# Patient Record
Sex: Female | Born: 1970
Health system: Southern US, Community
[De-identification: ages and names within clinical notes are randomized; demographics above are authoritative.]

## PROBLEM LIST (undated history)

## (undated) DIAGNOSIS — I499 Cardiac arrhythmia, unspecified: Secondary | ICD-10-CM

## (undated) DIAGNOSIS — O139 Gestational [pregnancy-induced] hypertension without significant proteinuria, unspecified trimester: Secondary | ICD-10-CM

## (undated) DIAGNOSIS — D649 Anemia, unspecified: Secondary | ICD-10-CM

## (undated) HISTORY — PX: WISDOM TOOTH EXTRACTION: SHX21

---

## 2000-01-09 ENCOUNTER — Ambulatory Visit (HOSPITAL_COMMUNITY): Admission: RE | Admit: 2000-01-09 | Discharge: 2000-01-09 | Payer: Self-pay | Admitting: Obstetrics

## 2000-04-07 ENCOUNTER — Inpatient Hospital Stay (HOSPITAL_COMMUNITY): Admission: AD | Admit: 2000-04-07 | Discharge: 2000-04-09 | Payer: Self-pay | Admitting: *Deleted

## 2000-04-12 ENCOUNTER — Encounter: Admission: RE | Admit: 2000-04-12 | Discharge: 2000-07-11 | Payer: Self-pay | Admitting: *Deleted

## 2001-11-20 ENCOUNTER — Inpatient Hospital Stay (HOSPITAL_COMMUNITY): Admission: AD | Admit: 2001-11-20 | Discharge: 2001-11-20 | Payer: Self-pay | Admitting: *Deleted

## 2001-12-25 ENCOUNTER — Ambulatory Visit (HOSPITAL_COMMUNITY): Admission: RE | Admit: 2001-12-25 | Discharge: 2001-12-25 | Payer: Self-pay | Admitting: *Deleted

## 2002-07-10 ENCOUNTER — Inpatient Hospital Stay (HOSPITAL_COMMUNITY): Admission: AD | Admit: 2002-07-10 | Discharge: 2002-07-12 | Payer: Self-pay | Admitting: Obstetrics and Gynecology

## 2004-09-12 ENCOUNTER — Ambulatory Visit: Payer: Self-pay | Admitting: Family Medicine

## 2004-09-13 ENCOUNTER — Ambulatory Visit: Payer: Self-pay | Admitting: *Deleted

## 2005-06-26 ENCOUNTER — Ambulatory Visit (HOSPITAL_COMMUNITY): Admission: RE | Admit: 2005-06-26 | Discharge: 2005-06-26 | Payer: Self-pay | Admitting: *Deleted

## 2005-08-28 ENCOUNTER — Ambulatory Visit (HOSPITAL_COMMUNITY): Admission: RE | Admit: 2005-08-28 | Discharge: 2005-08-28 | Payer: Self-pay | Admitting: *Deleted

## 2005-11-30 ENCOUNTER — Ambulatory Visit (HOSPITAL_COMMUNITY): Admission: RE | Admit: 2005-11-30 | Discharge: 2005-11-30 | Payer: Self-pay | Admitting: Obstetrics & Gynecology

## 2005-12-03 ENCOUNTER — Inpatient Hospital Stay (HOSPITAL_COMMUNITY): Admission: AD | Admit: 2005-12-03 | Discharge: 2005-12-05 | Payer: Self-pay | Admitting: Obstetrics

## 2007-03-05 IMAGING — US US OB COMP +14 WK
1 series · 13 of 28 positions shown · non-contrast
Comparison: none

CLINICAL DATA: Anatomic evaluation.

[Series 1: us ob comp +14 wk · 0.24mm/px · 13 of 76 slices shown]
[im 3/76]
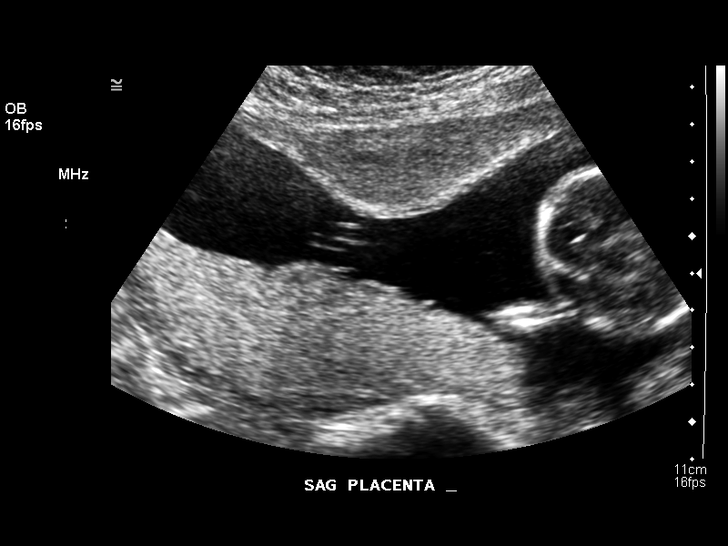
[im 9/76]
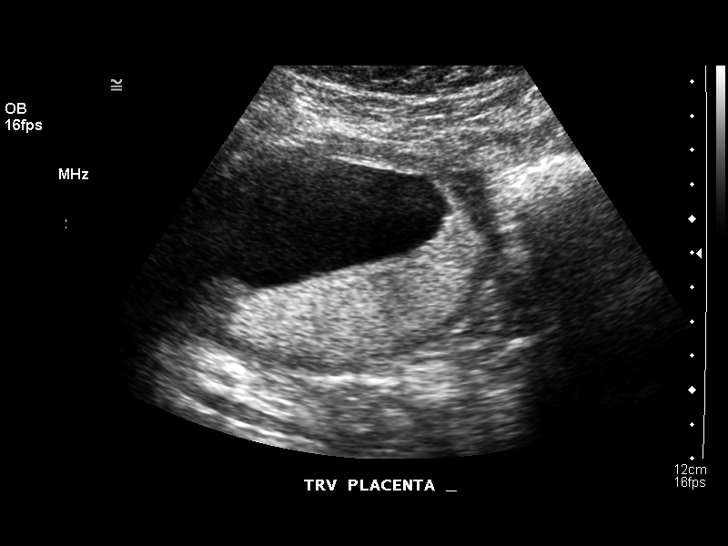
[im 14/76]
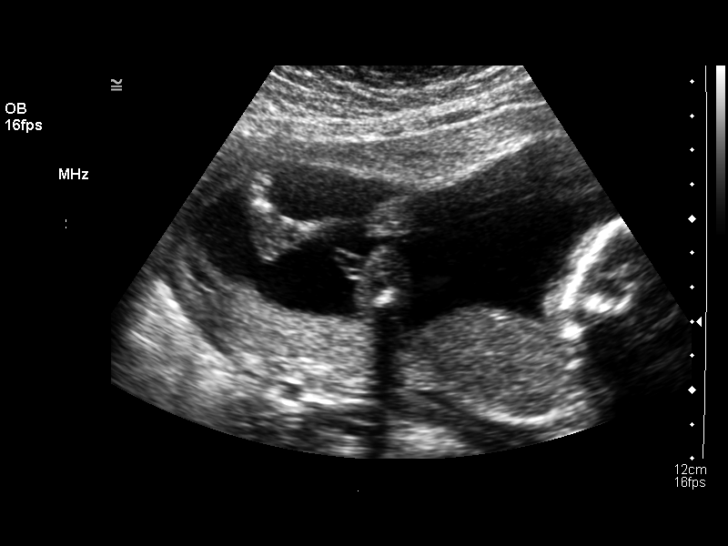
[im 20/76]
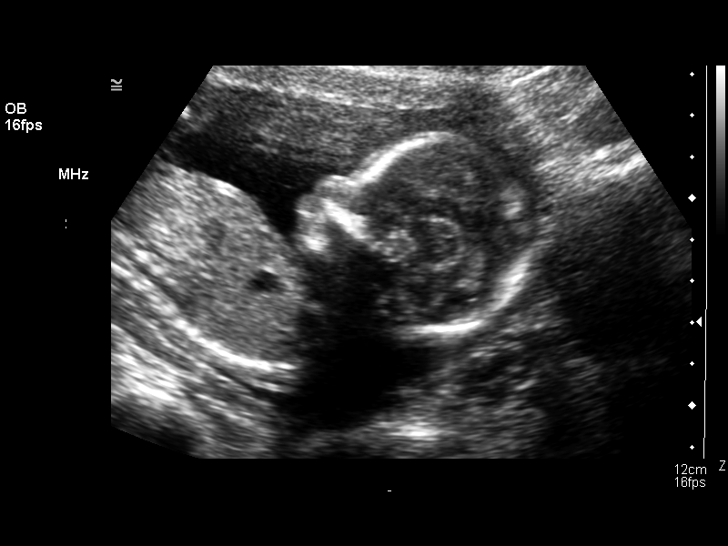
[im 26/76]
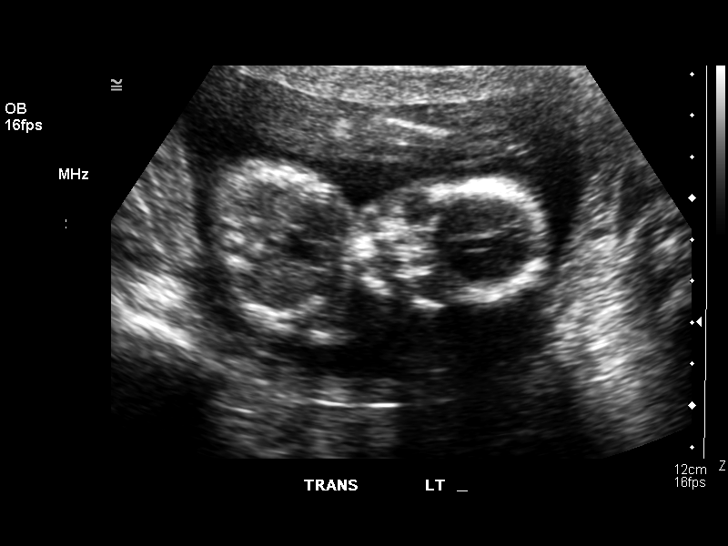
[im 31/76]
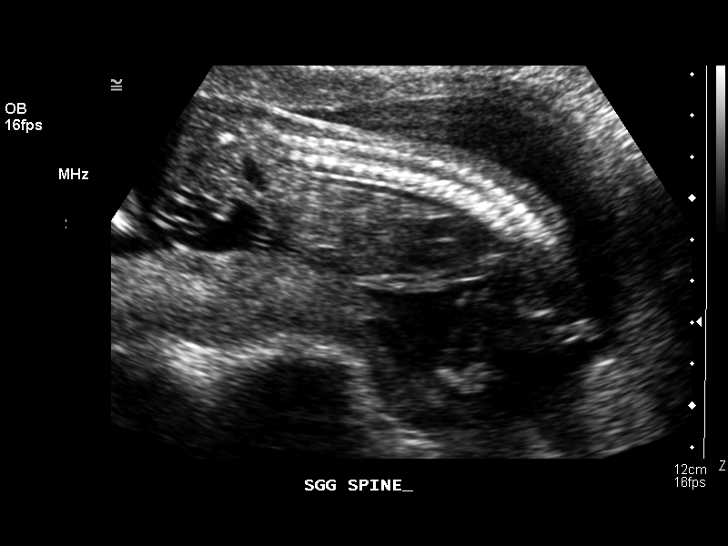
[im 39/76]
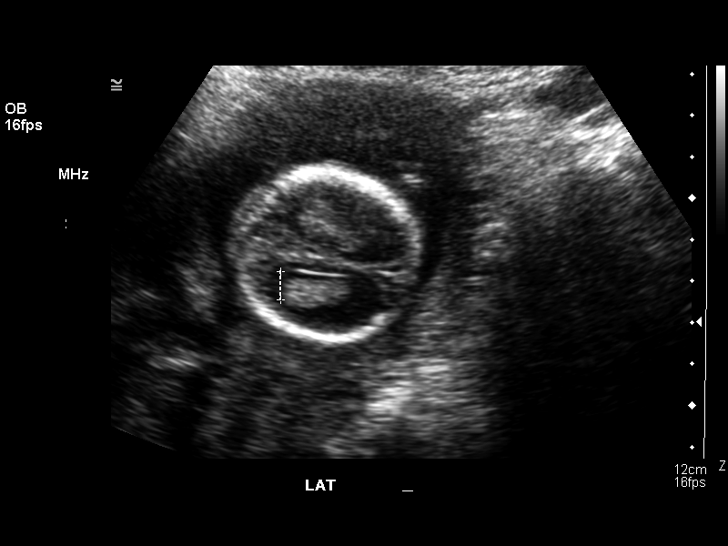
[im 45/76]
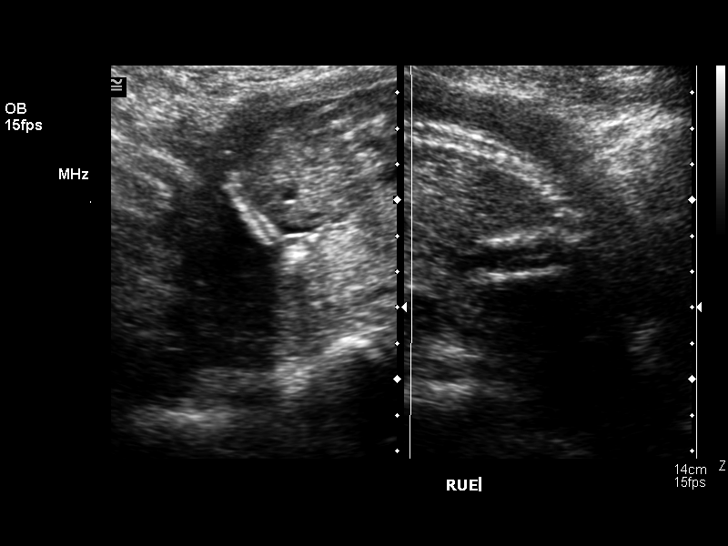
[im 51/76]
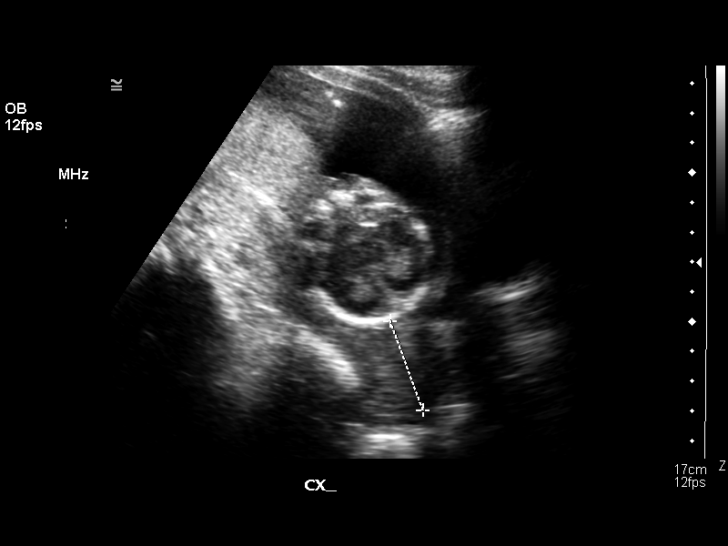
[im 56/76]
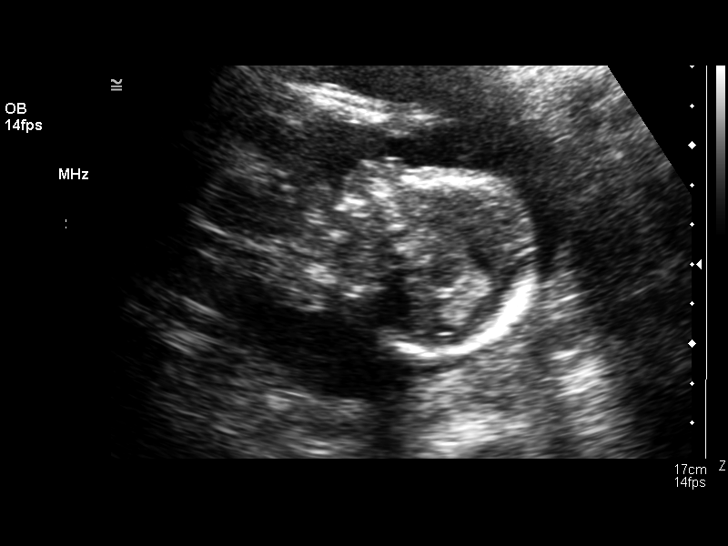
[im 62/76]
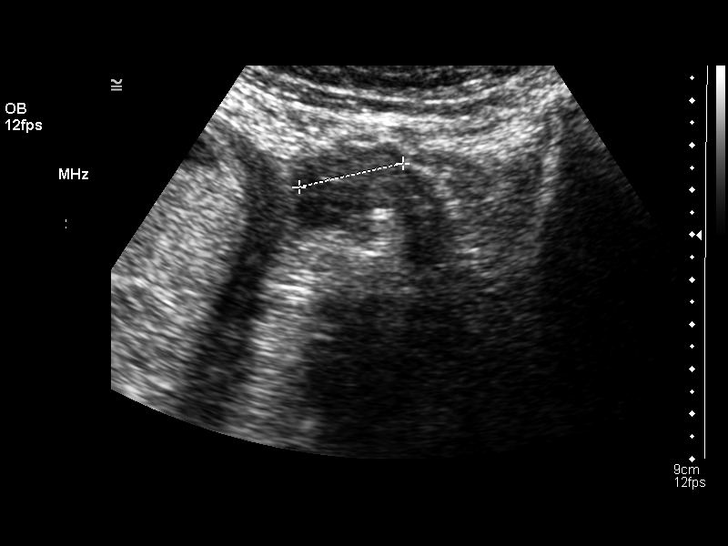
[im 67/76]
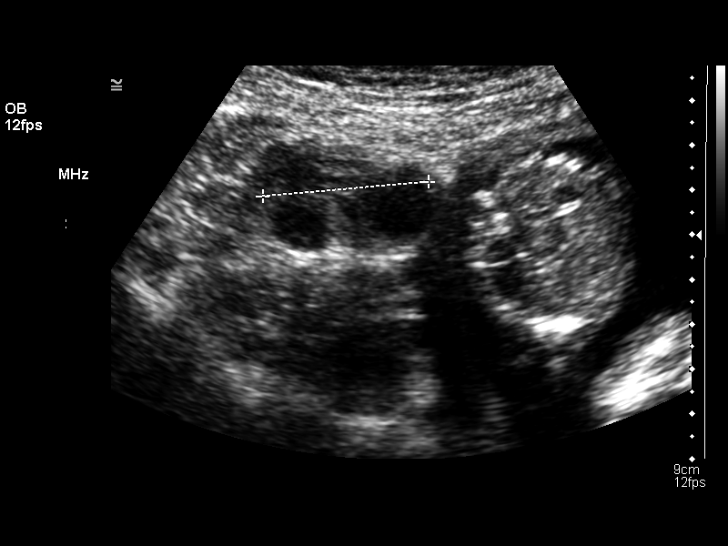
[im 73/76]
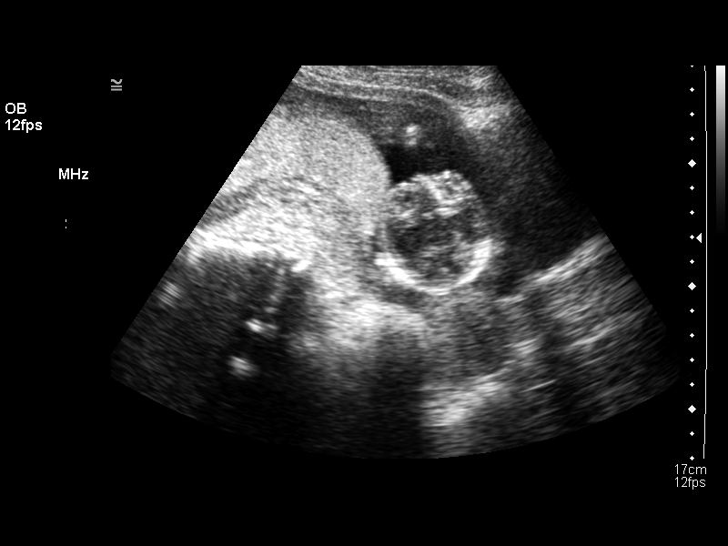

[13 of 28 positions shown; findings below may reference images not displayed]

OBSTETRICAL ULTRASOUND:
Number of Fetuses:  1
Heart Rate:  153
Movement:  Yes
Breathing:  No  
Presentation:  Cephalic
Placental Location:  Posterior
Grade:  I
Previa:  No
Amniotic Fluid (Subjective):  Normal
Amniotic Fluid (Objective):   6.1 cm Vertical pocket 

FETAL BIOMETRY
BPD:  4.2 cm   18 w 6 d
HC:  15.0 cm  17 w 6 d
AC:  12.4 cm   18 w 0 d
FL:    2.5 cm   17 w 4 d

MEAN GA:  18 w 1 d

FETAL ANATOMY
Lateral Ventricles:    Visualized 
Thalami/CSP:      Visualized 
Posterior Fossa:  Visualized 
Nuchal Region:    Visualized 
Spine:      Visualized 
4 Chamber Heart on Left:      Visualized 
Stomach on Left:      Visualized 
3 Vessel Cord:    Visualized 
Cord Insertion site:    Visualized 
Kidneys:  Visualized 
Bladder:  Visualized 
Extremities:      Visualized 

ADDITIONAL ANATOMY VISUALIZED:  LVOT, upper lip, orbits, profile, diaphragm, heel, 5th digit, ductal arch, aortic arch, male genitalia and nasal bone.

MATERNAL UTERINE AND ADNEXAL FINDINGS
Cervix:   3.4 cm transabdominally
IMPRESSION: 1.  Single intrauterine pregnancy demonstrating an estimated gestational age by ultrasound of 18 weeks and 1 day.  Correlation with expected estimated gestational age by LMP of 17 weeks and 5 days suggests appropriate growth.
2.  No focal fetal or placental abnormalities are noted with a good anatomic exam possible.  Only the RVOT could not be seen with confidence due to positioning on today?s exam.

## 2010-09-24 ENCOUNTER — Inpatient Hospital Stay (HOSPITAL_COMMUNITY)
Admission: AD | Admit: 2010-09-24 | Discharge: 2010-09-24 | Payer: Self-pay | Source: Home / Self Care | Attending: Obstetrics & Gynecology | Admitting: Obstetrics & Gynecology

## 2010-09-27 ENCOUNTER — Ambulatory Visit (HOSPITAL_COMMUNITY)
Admission: RE | Admit: 2010-09-27 | Discharge: 2010-09-27 | Payer: Self-pay | Source: Home / Self Care | Attending: Obstetrics & Gynecology | Admitting: Obstetrics & Gynecology

## 2010-09-29 ENCOUNTER — Ambulatory Visit: Payer: Self-pay | Admitting: Obstetrics and Gynecology

## 2010-10-02 NOTE — L&D Delivery Note (Signed)
Delivery Note At 4:58 PM a viable female was delivered via Vaginal, Spontaneous Delivery (Presentation: Vtx, LOA, compound with posterior arm ).  APGAR: 9, 9; weight 7 lb 5.3 oz (3325 g).   Placenta status: Intact, Spontaneous.  Cord: 3 vessels with the following complications: loose nuchal cord x 1, easily reduced.    Anesthesia: Epidural  Episiotomy: None Lacerations: 2nd degree Suture Repair: 2.0 3.0 vicryl rapide Est. Blood Loss (mL): 300 ml  Mom to postpartum.  Baby to nursery-stable.  JACKSON-MOORE,Raesha Coonrod A 08/02/2011, 5:34 PM

## 2010-11-23 ENCOUNTER — Inpatient Hospital Stay (INDEPENDENT_AMBULATORY_CARE_PROVIDER_SITE_OTHER)
Admission: RE | Admit: 2010-11-23 | Discharge: 2010-11-23 | Disposition: A | Discharge status: Home / Self Care | Payer: Self-pay | Source: Ambulatory Visit | Attending: Family Medicine | Admitting: Family Medicine

## 2010-11-23 DIAGNOSIS — M549 Dorsalgia, unspecified: Secondary | ICD-10-CM

## 2010-11-23 LAB — POCT URINALYSIS DIPSTICK
Bilirubin Urine: NEGATIVE
Protein, ur: NEGATIVE mg/dL
Specific Gravity, Urine: >=1.03 (ref 1.005–1.030)
Urobilinogen, UA: 0.2 mg/dL (ref 0.0–1.0)

## 2010-11-24 LAB — URINE CULTURE: Colony Count: NO GROWTH

## 2010-12-12 LAB — WET PREP, GENITAL
Clue Cells Wet Prep HPF POC: NONE SEEN
Yeast Wet Prep HPF POC: NONE SEEN

## 2010-12-12 LAB — URINALYSIS, ROUTINE W REFLEX MICROSCOPIC
Bilirubin Urine: NEGATIVE
Nitrite: NEGATIVE
Urobilinogen, UA: 0.2 mg/dL (ref 0.0–1.0)
pH: 6 (ref 5.0–8.0)

## 2010-12-12 LAB — URINE MICROSCOPIC-ADD ON

## 2010-12-12 LAB — HCG, QUANTITATIVE, PREGNANCY: hCG, Beta Chain, Quant, S: <2 m[IU]/mL (ref <5)

## 2010-12-12 LAB — GC/CHLAMYDIA PROBE AMP, GENITAL: GC Probe Amp, Genital: NEGATIVE

## 2011-01-04 ENCOUNTER — Inpatient Hospital Stay (HOSPITAL_COMMUNITY)
Admission: AD | Admit: 2011-01-04 | Discharge: 2011-01-04 | Disposition: A | Discharge status: Home / Self Care | Payer: Medicaid Other | Source: Ambulatory Visit | Attending: Obstetrics & Gynecology | Admitting: Obstetrics & Gynecology

## 2011-01-04 DIAGNOSIS — O99019 Anemia complicating pregnancy, unspecified trimester: Secondary | ICD-10-CM | POA: Insufficient documentation

## 2011-01-04 DIAGNOSIS — D649 Anemia, unspecified: Secondary | ICD-10-CM | POA: Insufficient documentation

## 2011-01-04 LAB — CBC
Hemoglobin: 7.5 g/dL — ABNORMAL LOW (ref 12.0–15.0)
MCV: 62.9 fL — ABNORMAL LOW (ref 78.0–100.0)
Platelets: 300 10*3/uL (ref 150–400)
WBC: 8.1 10*3/uL (ref 4.0–10.5)

## 2011-01-04 LAB — URINALYSIS, ROUTINE W REFLEX MICROSCOPIC: Nitrite: NEGATIVE

## 2011-01-04 LAB — URINE MICROSCOPIC-ADD ON

## 2011-01-31 LAB — RPR: RPR: NONREACTIVE

## 2011-01-31 LAB — ABO/RH

## 2011-01-31 LAB — HEPATITIS B SURFACE ANTIGEN: Hepatitis B Surface Ag: NEGATIVE

## 2011-01-31 LAB — HIV ANTIBODY (ROUTINE TESTING W REFLEX): HIV: NONREACTIVE

## 2011-02-11 LAB — RPR: RPR: NONREACTIVE

## 2011-04-12 ENCOUNTER — Inpatient Hospital Stay (HOSPITAL_COMMUNITY)
Admission: AD | Admit: 2011-04-12 | Discharge: 2011-04-12 | Disposition: A | Discharge status: Home / Self Care | Payer: Medicaid Other | Source: Ambulatory Visit | Attending: Obstetrics | Admitting: Obstetrics

## 2011-07-27 ENCOUNTER — Encounter (HOSPITAL_COMMUNITY): Payer: Self-pay

## 2011-07-27 ENCOUNTER — Observation Stay (HOSPITAL_COMMUNITY)
Admission: AD | Admit: 2011-07-27 | Discharge: 2011-07-27 | Disposition: A | Discharge status: Home / Self Care | Payer: Medicaid Other | Source: Ambulatory Visit | Attending: Obstetrics & Gynecology | Admitting: Obstetrics & Gynecology

## 2011-07-27 DIAGNOSIS — O139 Gestational [pregnancy-induced] hypertension without significant proteinuria, unspecified trimester: Principal | ICD-10-CM | POA: Insufficient documentation

## 2011-07-27 HISTORY — DX: Cardiac arrhythmia, unspecified: I49.9

## 2011-07-27 HISTORY — DX: Gestational (pregnancy-induced) hypertension without significant proteinuria, unspecified trimester: O13.9

## 2011-07-27 HISTORY — DX: Anemia, unspecified: D64.9

## 2011-07-27 LAB — COMPREHENSIVE METABOLIC PANEL
ALT: 13 U/L (ref 0–35)
Albumin: 2.5 g/dL — ABNORMAL LOW (ref 3.5–5.2)
Alkaline Phosphatase: 113 U/L (ref 39–117)
BUN: 7 mg/dL (ref 6–23)
Chloride: 109 mEq/L (ref 96–112)
Glucose, Bld: 74 mg/dL (ref 70–99)
Potassium: 4 mEq/L (ref 3.5–5.1)
Sodium: 140 mEq/L (ref 135–145)
Total Bilirubin: 0.4 mg/dL (ref 0.3–1.2)

## 2011-07-27 LAB — STREP B DNA PROBE: GBS: NEGATIVE

## 2011-07-27 LAB — CBC
HCT: 38 % (ref 36.0–46.0)
MCV: 90.3 fL (ref 78.0–100.0)
Platelets: 220 10*3/uL (ref 150–400)
RBC: 4.21 MIL/uL (ref 3.87–5.11)
WBC: 5.4 10*3/uL (ref 4.0–10.5)

## 2011-07-27 LAB — LACTATE DEHYDROGENASE: LDH: 260 U/L — ABNORMAL HIGH (ref 94–250)

## 2011-07-27 MED ORDER — ONDANSETRON HCL 4 MG/2ML IJ SOLN: 4.0000 mg | Freq: Four times a day (QID) | INTRAMUSCULAR | Status: DC | PRN

## 2011-07-27 MED ORDER — OXYTOCIN 20 UNITS IN LACTATED RINGERS INFUSION - SIMPLE: 125.0000 mL/h | Freq: Once | INTRAVENOUS | Status: DC

## 2011-07-27 MED ORDER — OXYTOCIN 20 UNITS IN LACTATED RINGERS INFUSION - SIMPLE
1.0000 m[IU]/min | INTRAVENOUS | Status: DC
  Administered 2011-07-27: 2 m[IU]/min via INTRAVENOUS
  Filled 2011-07-27: qty 1000

## 2011-07-27 MED ORDER — HYDROXYZINE HCL 50 MG PO TABS: 50.0000 mg | ORAL_TABLET | Freq: Four times a day (QID) | ORAL | Status: DC | PRN

## 2011-07-27 MED ORDER — OXYCODONE-ACETAMINOPHEN 5-325 MG PO TABS: 2.0000 | ORAL_TABLET | ORAL | Status: DC | PRN

## 2011-07-27 MED ORDER — OXYTOCIN BOLUS FROM INFUSION
500.0000 mL | Freq: Once | INTRAVENOUS | Status: DC
  Filled 2011-07-27: qty 500

## 2011-07-27 MED ORDER — CITRIC ACID-SODIUM CITRATE 334-500 MG/5ML PO SOLN: 30.0000 mL | ORAL | Status: DC | PRN

## 2011-07-27 MED ORDER — ZOLPIDEM TARTRATE 10 MG PO TABS: 10.0000 mg | ORAL_TABLET | Freq: Every evening | ORAL | Status: DC | PRN

## 2011-07-27 MED ORDER — BUTORPHANOL TARTRATE 2 MG/ML IJ SOLN: 1.0000 mg | INTRAMUSCULAR | Status: DC | PRN

## 2011-07-27 MED ORDER — HYDROXYZINE HCL 50 MG/ML IM SOLN: 50.0000 mg | Freq: Four times a day (QID) | INTRAMUSCULAR | Status: DC | PRN

## 2011-07-27 MED ORDER — LIDOCAINE HCL (PF) 1 % IJ SOLN: 30.0000 mL | INTRAMUSCULAR | Status: DC | PRN

## 2011-07-27 MED ORDER — ACETAMINOPHEN 325 MG PO TABS: 650.0000 mg | ORAL_TABLET | ORAL | Status: DC | PRN

## 2011-07-27 MED ORDER — IBUPROFEN 600 MG PO TABS: 600.0000 mg | ORAL_TABLET | Freq: Four times a day (QID) | ORAL | Status: DC | PRN

## 2011-07-27 MED ORDER — LACTATED RINGERS IV SOLN
INTRAVENOUS | Status: DC
  Administered 2011-07-27: 125 mL/h via INTRAVENOUS

## 2011-07-27 MED ORDER — TERBUTALINE SULFATE 1 MG/ML IJ SOLN: 0.2500 mg | Freq: Once | INTRAMUSCULAR | Status: DC | PRN

## 2011-07-27 MED ORDER — LACTATED RINGERS IV SOLN: 500.0000 mL | INTRAVENOUS | Status: DC | PRN

## 2011-07-27 NOTE — Discharge Summary (Signed)
Physician Discharge Summary  Patient ID: Becky Cruz MRN: [id number] DOB/AGE: 04/19/1971 40 y.o.  Admit date: 07/27/2011 Discharge date: 07/27/2011  Admission Diagnoses:  R/O PIH  Discharge Diagnoses:  Failed IOL  Discharged Condition: stable  Hospital Course: The patient was admitted.  A Pitocin drip was started.  There was no progressive cervical dilation.  The patient remained normotensive.  A PIH panel was essentially normal.  The Pitocin was discontinued and the patient discharged.   Treatments: See above  Discharge Exam: Blood pressure 126/71, pulse 78, temperature 98.4 F (36.9 C), temperature source Oral, resp. rate 18, height 5\' 4"  (1.626 m), weight 80.74 kg (178 lb). General appearance: alert and no distress  Disposition: Home or Self Care  Discharge Orders    Future Orders Please Complete By Expires   LABOR:  When conractions begin, you should start to time them from the beginning of one contraction to the beginning  of the next.  When contractions are 5 - 10 minutes apart or less and have been regular for at least an hour, you should call your health care provider.      Notify physician for bleeding from the vagina      Notify physician for pain or burning when urinating      Notify physician for chills or fever      Notify physician for increase in vaginal discharge      Notify physician for pelvic pressure (sudden increase)      Notify physician if baby moving less than usual      Notify physician for sudden, constant, or occasional abdominal pain      Notify physician for sudden gushing of fluid from the vagina (with or without continued leaking)      Notify physician for leaking of fluid      Notify physician for fainting spells, "black outs" or loss of consciousness      Notify physician for severe or continued nausea or vomiting      Notify physician for blurring of vision or spots before the eyes      Strep B DNA probe      Comments:   This external  order was created through the Results Console.   HIV antibody      Comments:   This external order was created through the Results Console.   Rubella antibody, IgM      Comments:   This external order was created through the Results Console.   Hepatitis B surface antigen      Comments:   This external order was created through the Results Console.   RPR      Comments:   This external order was created through the Results Console.   HIV antibody      Comments:   This external order was created through the Results Console.   RPR      Comments:   This external order was created through the Results Console.   Antibody screen      Comments:   This external order was created through the Results Console.   ABO/Rh      Comments:   This external order was created through the Results Console.     Current Discharge Medication List    CONTINUE these medications which have NOT CHANGED   Details  IRON PO Take 1 tablet by mouth daily.      prenatal vitamin w/FE, FA (PRENATAL 1 + 1) 27-1 MG TABS Take 1 tablet by mouth daily.  Follow-up Information    Follow up with Antionette Char A, MD. Call in 3 days.   Contact information:   [address] [place] [address] [phone]          Signed: Roseanna Rainbow 07/27/2011, 6:32 PM

## 2011-07-27 NOTE — Progress Notes (Signed)
Patient was told to come to hospital this am for induction of labor due to elevated BP. Patient reports good fetal movement and no symptoms, no leaking or bleeding.

## 2011-07-27 NOTE — Progress Notes (Signed)
Pt states sent from MD office for elevated bp's yesterday, denies bleeding or lof, does have decreased fm since last pm, denies pain at present. For induction today, however birthing suites is currently unable to take pt at present.

## 2011-07-27 NOTE — Discharge Summary (Signed)
Discharge instructions given to pt, pt verbalized understanding and had no questions. Will call dr. Isidore Moos and make appt for Monday.

## 2011-07-27 NOTE — Progress Notes (Signed)
Becky Cruz is a 40 y.o. G4P3003 at [redacted]w[redacted]d by LMP admitted for induction of labor due to [facility].  Subjective: No complaints  Objective: BP 126/71  Pulse 78  Temp(Src) 98.4 F (36.9 C) (Oral)  Resp 18  Ht 5\' 4"  (1.626 m)  Wt 80.74 kg (178 lb)  BMI 30.55 kg/m2      FHT:  FHR: 140 bpm, variability: moderate,  accelerations:  Present,  decelerations:  Absent UC:   irregular, every 10 minutes SVE:   Dilation: 2 Effacement (%): 60 Station: -2 Exam by:: felkel,rn  Labs: Lab Results  Component Value Date   WBC 5.4 07/27/2011   HGB 12.8 07/27/2011   HCT 38.0 07/27/2011   MCV 90.3 07/27/2011   PLT 220 07/27/2011    Assessment / Plan: BPs normal.  Labs essentially normal.  Minimal response to Pitocin  Labor: See above Preeclampsia:  See above Fetal Wellbeing:  Category I Pain Control:  Labor support without medications I/D:  n/a D/C home  JACKSON-MOORE,Jakarie Pember A 07/27/2011, 6:25 PM

## 2011-07-27 NOTE — H&P (Signed)
Becky Cruz is a 40 y.o. female presenting for IOL. Maternal Medical History:  Reason for admission: IOL for possible PIH.  BPs in office on 10/24 140s/80s.  No neurological symptoms.  Fetal activity: Perceived fetal activity is normal.   Last perceived fetal movement was within the past hour.    Prenatal complications: Maternal cardiac arrrhytmia    OB History    Grav Para Term Preterm Abortions TAB SAB Ect Mult Living   4 3 3       3      Past Medical History  Diagnosis Date  . Pregnancy induced hypertension   . Anemia   . Dysrhythmia    Past Surgical History  Procedure Date  . Wisdom tooth extraction    Family History: family history is not on file. Social History:  reports that she has never smoked. She has never used smokeless tobacco. She reports that she does not drink alcohol or use illicit drugs.  Review of Systems  Constitutional: Negative for fever.  Eyes: Negative for blurred vision.  Respiratory: Negative for shortness of breath.   Gastrointestinal: Negative for vomiting.  Skin: Negative for rash.  Neurological: Negative.  Negative for headaches.      Blood pressure 129/73, pulse 77, temperature 98.3 F (36.8 C), temperature source Oral, resp. rate 20, height 5\' 4"  (1.626 m), weight 80.74 kg (178 lb). Maternal Exam:  Abdomen: Patient reports no abdominal tenderness. Introitus: not evaluated.     Fetal Exam Fetal Monitor Review: Variability: moderate (6-25 bpm).   Pattern: accelerations present and no decelerations.    Fetal State Assessment: Category I - tracings are normal.     Physical Exam  Constitutional: She appears well-developed.  HENT:  Head: Normocephalic.  Neck: Neck supple. No thyromegaly present.  Cardiovascular: Normal rate and regular rhythm.   Respiratory: Breath sounds normal.  GI: Soft. Bowel sounds are normal.  Skin: No rash noted.    Prenatal labs: ABO, Rh: O/Positive/-- (05/01 1610) Antibody: Negative (05/01  0921) Rubella: Immune (05/01 0921) RPR: Nonreactive (05/12 0000)  HBsAg: Negative (05/01 9604)  HIV: Non-reactive (05/12 0000)  GBS: Negative (10/25 0901)   Assessment/Plan: Multipara with an IUP @ [redacted]w[redacted]d.  R/O PIH--mild.  Admission IOL Check PIH panel  JACKSON-MOORE,[name] 07/27/2011, 10:12 AM

## 2011-07-30 ENCOUNTER — Inpatient Hospital Stay (HOSPITAL_COMMUNITY)
Admission: AD | Admit: 2011-07-30 | Discharge: 2011-07-30 | Disposition: A | Discharge status: Home / Self Care | Payer: Medicaid Other | Source: Ambulatory Visit | Attending: Obstetrics & Gynecology | Admitting: Obstetrics & Gynecology

## 2011-07-30 DIAGNOSIS — O479 False labor, unspecified: Secondary | ICD-10-CM | POA: Insufficient documentation

## 2011-07-30 NOTE — ED Provider Notes (Signed)
C/o LOF at 1500 today, none since  FHR: 150 mod variability, + accels, no decels Toco: Irreg ctx SSE: no pooling, neg fern SVE: 1.5/50/vtx/no fluid return with exam  Becky Cruz E. 07/30/2011 10:48 PM

## 2011-07-30 NOTE — Progress Notes (Signed)
S.Shores in and did a spec exam to r/o rupture-SVD done also-sttes she feels the bag od water intact

## 2011-07-30 NOTE — Progress Notes (Signed)
Pt reports she noticed some fluid leaking at 1500, was not having contractions so at 1700 she started having some pressure and noticed a "few more drops of water", called the office number and they told her to come in. G4P3, all vaginal deliveries. Denies problems with preg.

## 2011-08-01 ENCOUNTER — Telehealth (HOSPITAL_COMMUNITY): Payer: Self-pay | Admitting: *Deleted

## 2011-08-01 ENCOUNTER — Other Ambulatory Visit: Payer: Self-pay | Admitting: Obstetrics & Gynecology

## 2011-08-01 ENCOUNTER — Encounter (HOSPITAL_COMMUNITY): Payer: Self-pay | Admitting: *Deleted

## 2011-08-01 NOTE — Telephone Encounter (Signed)
Preadmission screen  

## 2011-08-02 ENCOUNTER — Inpatient Hospital Stay (HOSPITAL_COMMUNITY)
Admission: RE | Admit: 2011-08-02 | Discharge: 2011-08-04 | DRG: 775 | Disposition: A | Discharge status: Home / Self Care | Payer: Medicaid Other | Source: Ambulatory Visit | Attending: Obstetrics & Gynecology | Admitting: Obstetrics & Gynecology

## 2011-08-02 ENCOUNTER — Encounter (HOSPITAL_COMMUNITY): Payer: Self-pay

## 2011-08-02 ENCOUNTER — Encounter (HOSPITAL_COMMUNITY): Payer: Self-pay | Admitting: Anesthesiology

## 2011-08-02 ENCOUNTER — Inpatient Hospital Stay (HOSPITAL_COMMUNITY): Payer: Medicaid Other | Admitting: Anesthesiology

## 2011-08-02 DIAGNOSIS — O481 Prolonged pregnancy: Secondary | ICD-10-CM | POA: Diagnosis present

## 2011-08-02 DIAGNOSIS — O09529 Supervision of elderly multigravida, unspecified trimester: Secondary | ICD-10-CM | POA: Diagnosis present

## 2011-08-02 LAB — URIC ACID: Uric Acid, Serum: 4.5 mg/dL (ref 2.4–7.0)

## 2011-08-02 LAB — COMPREHENSIVE METABOLIC PANEL
ALT: 14 U/L (ref 0–35)
AST: 17 U/L (ref 0–37)
Albumin: 2.4 g/dL — ABNORMAL LOW (ref 3.5–5.2)
Calcium: 9.6 mg/dL (ref 8.4–10.5)
Creatinine, Ser: 0.5 mg/dL (ref 0.50–1.10)
GFR calc non Af Amer: >90 mL/min (ref >90)
Sodium: 136 mEq/L (ref 135–145)
Total Protein: 6.4 g/dL (ref 6.0–8.3)

## 2011-08-02 LAB — CBC
HCT: 39.1 % (ref 36.0–46.0)
MCH: 30.9 pg (ref 26.0–34.0)
MCV: 90.7 fL (ref 78.0–100.0)
Platelets: 227 10*3/uL (ref 150–400)
RBC: 4.31 MIL/uL (ref 3.87–5.11)
WBC: 5.6 10*3/uL (ref 4.0–10.5)

## 2011-08-02 MED ORDER — HYDROXYZINE HCL 50 MG/ML IM SOLN
50.0000 mg | Freq: Four times a day (QID) | INTRAMUSCULAR | Status: DC | PRN
  Filled 2011-08-02: qty 1

## 2011-08-02 MED ORDER — EPHEDRINE 5 MG/ML INJ
10.0000 mg | INTRAVENOUS | Status: DC | PRN
  Filled 2011-08-02: qty 4

## 2011-08-02 MED ORDER — ZOLPIDEM TARTRATE 5 MG PO TABS: 5.0000 mg | ORAL_TABLET | Freq: Every evening | ORAL | Status: DC | PRN

## 2011-08-02 MED ORDER — IBUPROFEN 600 MG PO TABS
600.0000 mg | ORAL_TABLET | Freq: Four times a day (QID) | ORAL | Status: DC
  Administered 2011-08-02 – 2011-08-04 (×7): 600 mg via ORAL
  Filled 2011-08-02 (×7): qty 1

## 2011-08-02 MED ORDER — OXYCODONE-ACETAMINOPHEN 5-325 MG PO TABS: 1.0000 | ORAL_TABLET | ORAL | Status: DC | PRN

## 2011-08-02 MED ORDER — PHENYLEPHRINE 40 MCG/ML (10ML) SYRINGE FOR IV PUSH (FOR BLOOD PRESSURE SUPPORT)
80.0000 ug | PREFILLED_SYRINGE | INTRAVENOUS | Status: DC | PRN
  Filled 2011-08-02: qty 5

## 2011-08-02 MED ORDER — LACTATED RINGERS IV SOLN
INTRAVENOUS | Status: DC
  Administered 2011-08-02 (×2): via INTRAVENOUS
  Administered 2011-08-02: 999 mL/h via INTRAVENOUS

## 2011-08-02 MED ORDER — ZOLPIDEM TARTRATE 10 MG PO TABS: 10.0000 mg | ORAL_TABLET | Freq: Every evening | ORAL | Status: DC | PRN

## 2011-08-02 MED ORDER — LACTATED RINGERS IV SOLN: 500.0000 mL | Freq: Once | INTRAVENOUS | Status: DC

## 2011-08-02 MED ORDER — ONDANSETRON HCL 4 MG/2ML IJ SOLN: 4.0000 mg | Freq: Four times a day (QID) | INTRAMUSCULAR | Status: DC | PRN

## 2011-08-02 MED ORDER — FERROUS SULFATE 325 (65 FE) MG PO TABS
325.0000 mg | ORAL_TABLET | Freq: Two times a day (BID) | ORAL | Status: DC
  Administered 2011-08-03 – 2011-08-04 (×3): 325 mg via ORAL
  Filled 2011-08-02 (×3): qty 1

## 2011-08-02 MED ORDER — TETANUS-DIPHTH-ACELL PERTUSSIS 5-2.5-18.5 LF-MCG/0.5 IM SUSP: 0.5000 mL | Freq: Once | INTRAMUSCULAR | Status: DC

## 2011-08-02 MED ORDER — FENTANYL 2.5 MCG/ML BUPIVACAINE 1/10 % EPIDURAL INFUSION (WH - ANES)
INTRAMUSCULAR | Status: DC | PRN
  Administered 2011-08-02: 14 mL/h via EPIDURAL

## 2011-08-02 MED ORDER — ACETAMINOPHEN 325 MG PO TABS: 650.0000 mg | ORAL_TABLET | ORAL | Status: DC | PRN

## 2011-08-02 MED ORDER — WITCH HAZEL-GLYCERIN EX PADS: 1.0000 "application " | MEDICATED_PAD | CUTANEOUS | Status: DC | PRN

## 2011-08-02 MED ORDER — DIBUCAINE 1 % RE OINT: 1.0000 "application " | TOPICAL_OINTMENT | RECTAL | Status: DC | PRN

## 2011-08-02 MED ORDER — OXYTOCIN 20 UNITS IN LACTATED RINGERS INFUSION - SIMPLE: 125.0000 mL/h | Freq: Once | INTRAVENOUS | Status: DC

## 2011-08-02 MED ORDER — BUTORPHANOL TARTRATE 2 MG/ML IJ SOLN
1.0000 mg | INTRAMUSCULAR | Status: DC | PRN
  Administered 2011-08-02: 1 mg via INTRAVENOUS
  Filled 2011-08-02: qty 1

## 2011-08-02 MED ORDER — PHENYLEPHRINE 40 MCG/ML (10ML) SYRINGE FOR IV PUSH (FOR BLOOD PRESSURE SUPPORT)
80.0000 ug | PREFILLED_SYRINGE | INTRAVENOUS | Status: DC | PRN
  Filled 2011-08-02 (×2): qty 5

## 2011-08-02 MED ORDER — OXYTOCIN BOLUS FROM INFUSION
500.0000 mL | Freq: Once | INTRAVENOUS | Status: DC
  Filled 2011-08-02: qty 500

## 2011-08-02 MED ORDER — BENZOCAINE-MENTHOL 20-0.5 % EX AERO
INHALATION_SPRAY | CUTANEOUS | Status: AC
  Administered 2011-08-02: 1 via TOPICAL
  Filled 2011-08-02: qty 56

## 2011-08-02 MED ORDER — DIPHENHYDRAMINE HCL 50 MG/ML IJ SOLN: 12.5000 mg | INTRAMUSCULAR | Status: DC | PRN

## 2011-08-02 MED ORDER — TERBUTALINE SULFATE 1 MG/ML IJ SOLN: 0.2500 mg | Freq: Once | INTRAMUSCULAR | Status: DC | PRN

## 2011-08-02 MED ORDER — LIDOCAINE HCL (PF) 1 % IJ SOLN
30.0000 mL | INTRAMUSCULAR | Status: DC | PRN
  Administered 2011-08-02: 30 mL via SUBCUTANEOUS
  Filled 2011-08-02: qty 30

## 2011-08-02 MED ORDER — OXYCODONE-ACETAMINOPHEN 5-325 MG PO TABS: 2.0000 | ORAL_TABLET | ORAL | Status: DC | PRN

## 2011-08-02 MED ORDER — MAGNESIUM HYDROXIDE 400 MG/5ML PO SUSP: 30.0000 mL | ORAL | Status: DC | PRN

## 2011-08-02 MED ORDER — EPHEDRINE 5 MG/ML INJ
10.0000 mg | INTRAVENOUS | Status: DC | PRN
  Administered 2011-08-02: 10 mg via INTRAVENOUS
  Filled 2011-08-02 (×2): qty 4

## 2011-08-02 MED ORDER — MEASLES, MUMPS & RUBELLA VAC ~~LOC~~ INJ
0.5000 mL | INJECTION | Freq: Once | SUBCUTANEOUS | Status: DC
  Filled 2011-08-02: qty 0.5

## 2011-08-02 MED ORDER — MEDROXYPROGESTERONE ACETATE 150 MG/ML IM SUSP: 150.0000 mg | INTRAMUSCULAR | Status: DC | PRN

## 2011-08-02 MED ORDER — ONDANSETRON HCL 4 MG PO TABS: 4.0000 mg | ORAL_TABLET | ORAL | Status: DC | PRN

## 2011-08-02 MED ORDER — LANOLIN HYDROUS EX OINT: TOPICAL_OINTMENT | CUTANEOUS | Status: DC | PRN

## 2011-08-02 MED ORDER — BENZOCAINE-MENTHOL 20-0.5 % EX AERO
1.0000 "application " | INHALATION_SPRAY | CUTANEOUS | Status: DC | PRN
  Administered 2011-08-02: 1 via TOPICAL

## 2011-08-02 MED ORDER — PRENATAL PLUS 27-1 MG PO TABS: 1.0000 | ORAL_TABLET | Freq: Every day | ORAL | Status: DC

## 2011-08-02 MED ORDER — SENNOSIDES-DOCUSATE SODIUM 8.6-50 MG PO TABS: 2.0000 | ORAL_TABLET | Freq: Every day | ORAL | Status: DC

## 2011-08-02 MED ORDER — OXYTOCIN 20 UNITS IN LACTATED RINGERS INFUSION - SIMPLE
1.0000 m[IU]/min | INTRAVENOUS | Status: DC
  Administered 2011-08-02: 2 m[IU]/min via INTRAVENOUS
  Filled 2011-08-02: qty 1000

## 2011-08-02 MED ORDER — HYDROXYZINE HCL 50 MG PO TABS
50.0000 mg | ORAL_TABLET | Freq: Four times a day (QID) | ORAL | Status: DC | PRN
  Filled 2011-08-02: qty 1

## 2011-08-02 MED ORDER — FENTANYL 2.5 MCG/ML BUPIVACAINE 1/10 % EPIDURAL INFUSION (WH - ANES)
14.0000 mL/h | INTRAMUSCULAR | Status: DC
  Filled 2011-08-02: qty 60

## 2011-08-02 MED ORDER — ONDANSETRON HCL 4 MG/2ML IJ SOLN: 4.0000 mg | INTRAMUSCULAR | Status: DC | PRN

## 2011-08-02 MED ORDER — DIPHENHYDRAMINE HCL 25 MG PO CAPS: 25.0000 mg | ORAL_CAPSULE | Freq: Four times a day (QID) | ORAL | Status: DC | PRN

## 2011-08-02 MED ORDER — LACTATED RINGERS IV SOLN: 500.0000 mL | INTRAVENOUS | Status: DC | PRN

## 2011-08-02 MED ORDER — LIDOCAINE HCL 1.5 % IJ SOLN
INTRAMUSCULAR | Status: DC | PRN
  Administered 2011-08-02 (×2): 5 mL via EPIDURAL

## 2011-08-02 MED ORDER — IBUPROFEN 600 MG PO TABS: 600.0000 mg | ORAL_TABLET | Freq: Four times a day (QID) | ORAL | Status: DC | PRN

## 2011-08-02 MED ORDER — CITRIC ACID-SODIUM CITRATE 334-500 MG/5ML PO SOLN: 30.0000 mL | ORAL | Status: DC | PRN

## 2011-08-02 NOTE — Anesthesia Preprocedure Evaluation (Signed)
Anesthesia Evaluation  Patient identified by MRN, date of birth, ID band Patient awake  General Assessment Comment  Reviewed: Allergy & Precautions, H&P , Patient's Chart, lab work & pertinent test results  Airway Mallampati: II TM Distance: >3 FB Neck ROM: full    Dental No notable dental hx.    Pulmonary    Pulmonary exam normal       Cardiovascular hypertension,     Neuro/Psych Negative Neurological ROS  Negative Psych ROS   GI/Hepatic negative GI ROS Neg liver ROS    Endo/Other  Negative Endocrine ROS  Renal/GU negative Renal ROS  Genitourinary negative   Musculoskeletal   Abdominal Normal abdominal exam  (+)   Peds negative pediatric ROS (+)  Hematology negative hematology ROS (+)   Anesthesia Other Findings   Reproductive/Obstetrics (+) Pregnancy                           Anesthesia Physical Anesthesia Plan  ASA: II  Anesthesia Plan: Epidural   Post-op Pain Management:    Induction:   Airway Management Planned:   Additional Equipment:   Intra-op Plan:   Post-operative Plan:   Informed Consent: I have reviewed the patients History and Physical, chart, labs and discussed the procedure including the risks, benefits and alternatives for the proposed anesthesia with the patient or authorized representative who has indicated his/her understanding and acceptance.     Plan Discussed with:   Anesthesia Plan Comments:         Anesthesia Quick Evaluation

## 2011-08-02 NOTE — H&P (Signed)
Becky Cruz is a 40 y.o. female presenting for IOL. Maternal Medical History:  Reason for admission: IOL  Fetal activity: Perceived fetal activity is normal.    Prenatal complications: Maternal tachyarrhythmia--no medications    OB History    Grav Para Term Preterm Abortions TAB SAB Ect Mult Living   4 3 3       3      Past Medical History  Diagnosis Date  . Pregnancy induced hypertension   . Anemia   . Dysrhythmia    Past Surgical History  Procedure Date  . Wisdom tooth extraction    Family History: family history includes Diabetes in her maternal grandmother.  There is no history of Anesthesia problems, and Hypotension, and Malignant hyperthermia, and Pseudochol deficiency, . Social History:  reports that she has never smoked. She has never used smokeless tobacco. She reports that she does not drink alcohol or use illicit drugs.  Review of Systems  Constitutional: Negative for fever.  Eyes: Negative for blurred vision.  Respiratory: Negative for shortness of breath.   Gastrointestinal: Negative for vomiting.  Skin: Negative for rash.  Neurological: Negative for headaches.    Dilation: 2 Effacement (%): 50 Station: -2 Exam by:: dr. Tamela Oddi Blood pressure 130/85, pulse 83, temperature 98.9 F (37.2 C), temperature source Oral, resp. rate 20, height 5\' 3"  (1.6 m), weight 81.194 kg (179 lb). Maternal Exam:  Abdomen: Patient reports no abdominal tenderness. Introitus: not evaluated.     Fetal Exam Fetal Monitor Review: Variability: moderate (6-25 bpm).   Pattern: accelerations present and no decelerations.    Fetal State Assessment: Category I - tracings are normal.     Physical Exam  Constitutional: She appears well-developed.  HENT:  Head: Normocephalic.  Neck: Neck supple. No thyromegaly present.  Cardiovascular: Normal rate and regular rhythm.   Respiratory: Breath sounds normal.  GI: Soft. Bowel sounds are normal.  Skin: No rash noted.      Prenatal labs: ABO, Rh: O/Positive/-- (05/01 5621) Antibody: Negative (05/01 0921) Rubella: Immune (05/01 0921) RPR: NON REACTIVE (10/25 0920)  HBsAg: Negative (05/01 0921)  HIV: Non-reactive (05/12 0000)  GBS: Negative (10/25 0901)   Assessment/Plan: 40 y.o. multipara with an IUP @ [redacted]w[redacted]d.  For elective IOL.  Admit Low dose Pitocin per protocol  JACKSON-MOORE,Jyair Kiraly A 08/02/2011, 10:39 AM

## 2011-08-02 NOTE — Anesthesia Procedure Notes (Signed)
Epidural Patient location during procedure: OB Start time: 08/02/2011 2:55 PM End time: 08/02/2011 3:02 PM Reason for block: procedure for pain  Staffing Anesthesiologist: Sandrea Hughs Performed by: anesthesiologist   Preanesthetic Checklist Completed: patient identified, site marked, surgical consent, pre-op evaluation, timeout performed, IV checked, risks and benefits discussed and monitors and equipment checked  Epidural Patient position: sitting Prep: site prepped and draped and DuraPrep Patient monitoring: continuous pulse ox and blood pressure Approach: midline Injection technique: LOR air  Needle:  Needle type: Tuohy  Needle gauge: 17 G Needle length: 9 cm Needle insertion depth: 6 cm Catheter type: closed end flexible Catheter size: 19 Gauge Catheter at skin depth: 11 cm Test dose: negative and 1.5% lidocaine  Assessment Sensory level: T8 Events: blood not aspirated, injection not painful, no injection resistance, negative IV test and no paresthesia

## 2011-08-03 LAB — CBC
MCH: 30.4 pg (ref 26.0–34.0)
MCV: 90.9 fL (ref 78.0–100.0)
Platelets: 199 10*3/uL (ref 150–400)
RDW: 14.4 % (ref 11.5–15.5)

## 2011-08-03 NOTE — Anesthesia Postprocedure Evaluation (Signed)
  Anesthesia Post-op Note  Patient: Becky Cruz  Procedure(s) Performed: * No procedures listed *  Patient Location: Mother/Baby  Anesthesia Type: Epidural  Level of Consciousness: awake, alert  and oriented  Airway and Oxygen Therapy: Patient Spontanous Breathing  Post-op Pain: none  Post-op Assessment: Post-op Vital signs reviewed and Patient's Cardiovascular Status Stable  Post-op Vital Signs: Reviewed and stable  Complications: No apparent anesthesia complications

## 2011-08-03 NOTE — Progress Notes (Signed)
UR chart review completed.  

## 2011-08-03 NOTE — Progress Notes (Signed)
  Post Partum Day 1 S/P spontaneous vaginal RH status/Rubella reviewed.  Feeding: breast Subjective: No HA, SOB, CP, F/C, breast symptoms. Normal vaginal bleeding, no clots.     Objective: BP 114/73  Pulse 87  Temp(Src) 98 F (36.7 C) (Oral)  Resp 20  Ht 5\' 3"  (1.6 m)  Wt 81.194 kg (179 lb)  BMI 31.71 kg/m2  SpO2 99%   Physical Exam:   Pt in bathroom  Basename 08/03/11 0525 08/02/11 0800  HGB 12.0 13.3  HCT 35.9* 39.1      Assessment/Plan: 40 y.o.  PPD #1 .  normal postpartum exam Continue current postpartum care Ambulate   LOS: 1 day   JACKSON-MOORE,Tenika Keeran A 08/03/2011, 12:17 PM

## 2011-08-03 NOTE — Anesthesia Postprocedure Evaluation (Signed)
Anesthesia Post Note  Patient: Becky Cruz  Procedure(s) Performed: * No procedures listed *  Anesthesia type: Spinal  Patient location: PACU  Post pain: Pain level controlled  Post assessment: Post-op Vital signs reviewed  Last Vitals:  Filed Vitals:   08/03/11 0538  BP: 119/76  Pulse: 76  Temp: 37 C  Resp: 20    Post vital signs: Reviewed  Level of consciousness: awake  Complications: No apparent anesthesia complications

## 2011-08-04 ENCOUNTER — Encounter (HOSPITAL_COMMUNITY): Payer: Self-pay

## 2011-08-04 MED ORDER — PRENATAL PLUS 27-1 MG PO TABS: 1.0000 | ORAL_TABLET | Freq: Every day | ORAL | Status: DC

## 2011-08-04 MED ORDER — IBUPROFEN 600 MG PO TABS: 600.0000 mg | ORAL_TABLET | Freq: Four times a day (QID) | ORAL | Status: AC

## 2011-08-04 NOTE — Discharge Summary (Signed)
  Obstetric Discharge Summary Reason for Admission: onset of labor Prenatal Procedures: none Intrapartum Procedures: spontaneous vaginal delivery Postpartum Procedures: none Complications-Operative and Postpartum: 2nd degree perineal laceration  Hemoglobin  Date Value Range Status  08/03/2011 12.0  12.0-15.0 (g/dL) Final     HCT  Date Value Range Status  08/03/2011 35.9* 36.0-46.0 (%) Final    Discharge Diagnoses: Term Pregnancy-delivered  Discharge Information: Date: 08/04/2011 Activity: pelvic rest Diet: routine Medications: Ibuprofen Condition: stable Instructions: refer to routine discharge instructions Discharge to: home Follow-up Information    Follow up with Antionette Char A, MD. Call in 3 weeks.   Contact information:   [address] [place] [address] [phone]          Newborn Data: Live born  Information for the patient's newborn:  Becky Cruz, Becky Cruz [[id number]]  female ; APGAR , ; weight ;  Home with mother.  Becky Cruz,Becky Cruz 08/04/2011, 9:31 AM

## 2011-08-04 NOTE — Progress Notes (Signed)
Mrs.Mondor  Is requesting  A prescription for prenatal vitamin

## 2011-08-04 NOTE — Progress Notes (Signed)
Post Partum Day #2 S/P:spontaneous vaginal  RH status/Rubella reviewed.  Feeding: breast Subjective: No HA, SOB, CP, F/C, breast symptoms: Yes. Normal vaginal bleeding, no clots.     Objective:  Blood pressure 108/67, pulse 90, temperature 98.2 F (36.8 C), temperature source Oral, resp. rate 18, height 5\' 3"  (1.6 m), weight 81.194 kg (179 lb), SpO2 99.00%.   Physical Exam:  General: alert Lochia: appropriate Uterine Fundus: firm DVT Evaluation: No evidence of DVT seen on physical exam. Ext: No c/c/e  Basename 08/03/11 0525 08/02/11 0800  HGB 12.0 13.3  HCT 35.9* 39.1    Assessment/Plan: 40 y.o.  PPD # 2 .  normal postpartum exam patient is a candidate for IUD for contraception, with no contraindications Continue current postpartum care D/C home   LOS: 2 days   JACKSON-MOORE,Anieya Helman A 08/04/2011, 9:25 AM

## 2013-05-06 ENCOUNTER — Ambulatory Visit: Payer: No Typology Code available for payment source | Attending: Family Medicine | Admitting: Internal Medicine

## 2013-05-06 ENCOUNTER — Other Ambulatory Visit (HOSPITAL_COMMUNITY)
Admission: RE | Admit: 2013-05-06 | Discharge: 2013-05-06 | Disposition: A | Discharge status: Home / Self Care | Payer: No Typology Code available for payment source | Source: Ambulatory Visit | Attending: Internal Medicine | Admitting: Internal Medicine

## 2013-05-06 VITALS — BP 127/82 | HR 67 | Temp 98.7°F | Resp 16 | Wt 168.4 lb

## 2013-05-06 DIAGNOSIS — Z01419 Encounter for gynecological examination (general) (routine) without abnormal findings: Secondary | ICD-10-CM | POA: Insufficient documentation

## 2013-05-06 DIAGNOSIS — Z124 Encounter for screening for malignant neoplasm of cervix: Secondary | ICD-10-CM

## 2013-05-06 DIAGNOSIS — Z1151 Encounter for screening for human papillomavirus (HPV): Secondary | ICD-10-CM | POA: Insufficient documentation

## 2013-05-06 DIAGNOSIS — N912 Amenorrhea, unspecified: Secondary | ICD-10-CM | POA: Insufficient documentation

## 2013-05-06 DIAGNOSIS — Z7189 Other specified counseling: Secondary | ICD-10-CM

## 2013-05-06 DIAGNOSIS — Z7689 Persons encountering health services in other specified circumstances: Secondary | ICD-10-CM

## 2013-05-06 LAB — LIPID PANEL
LDL Cholesterol: 81 mg/dL (ref 0–99)
VLDL: 23 mg/dL (ref 0–40)

## 2013-05-06 LAB — COMPREHENSIVE METABOLIC PANEL
Alkaline Phosphatase: 87 U/L (ref 39–117)
Creat: 0.65 mg/dL (ref 0.50–1.10)
Glucose, Bld: 87 mg/dL (ref 70–99)
Sodium: 137 mEq/L (ref 135–145)
Total Bilirubin: 0.5 mg/dL (ref 0.3–1.2)
Total Protein: 7.6 g/dL (ref 6.0–8.3)

## 2013-05-06 LAB — CBC WITH DIFFERENTIAL/PLATELET
Basophils Relative: 1 % (ref 0–1)
Eosinophils Absolute: 0.2 10*3/uL (ref 0.0–0.7)
Eosinophils Relative: 3 % (ref 0–5)
Hemoglobin: 11.9 g/dL — ABNORMAL LOW (ref 12.0–15.0)
MCH: 25.9 pg — ABNORMAL LOW (ref 26.0–34.0)
MCHC: 33 g/dL (ref 30.0–36.0)
Monocytes Relative: 8 % (ref 3–12)
Neutrophils Relative %: 63 % (ref 43–77)
Platelets: 281 10*3/uL (ref 150–400)

## 2013-05-06 MED ORDER — MULTI-VITAMIN/MINERALS PO TABS: ORAL_TABLET | ORAL | Status: DC

## 2013-05-06 NOTE — Progress Notes (Signed)
Patient ID: Becky Cruz, female   DOB: [birth year], 42 y.o.   MRN: [id number]  CC:  HPI: 42 year old female who is here for stent evaluation of secondary amenorrhea, the patient does not have any known medical problems, she is gravida x4. Her youngest is 69 months old. Patient is currently breast-feeding and has not had a period for about 5 months. She denies any abnormal vaginal bleeding, she occasionally has pain in her left flank, she is not the complaints   No Known Allergies Past Medical History  Diagnosis Date  . Pregnancy induced hypertension   . Anemia   . Dysrhythmia    Current Outpatient Prescriptions on File Prior to Visit  Medication Sig Dispense Refill  . IRON PO Take 1 tablet by mouth daily.        Marland Kitchen loratadine (CLARITIN) 10 MG tablet Take 10 mg by mouth daily.        . prenatal vitamin w/FE, FA (PRENATAL 1 + 1) 27-1 MG TABS Take 1 tablet by mouth daily.  60 each  6   No current facility-administered medications on file prior to visit.   Family History  Problem Relation Age of Onset  . Anesthesia problems Neg Hx   . Hypotension Neg Hx   . Malignant hyperthermia Neg Hx   . Pseudochol deficiency Neg Hx   . Diabetes Maternal Grandmother    History   Social History  . Marital Status: Married    Spouse Name: N/A    Number of Children: N/A  . Years of Education: N/A   Occupational History  . Not on file.   Social History Main Topics  . Smoking status: Never Smoker   . Smokeless tobacco: Never Used  . Alcohol Use: No  . Drug Use: No  . Sexually Active: Yes    Birth Control/ Protection: None   Other Topics Concern  . Not on file   Social History Narrative  . No narrative on file    Review of Systems  Constitutional: Negative for fever, chills, diaphoresis, activity change, appetite change and fatigue.  HENT: Negative for ear pain, nosebleeds, congestion, facial swelling, rhinorrhea, neck pain, neck stiffness and ear discharge.   Eyes: Negative for pain,  discharge, redness, itching and visual disturbance.  Respiratory: Negative for cough, choking, chest tightness, shortness of breath, wheezing and stridor.   Cardiovascular: Negative for chest pain, palpitations and leg swelling.  Gastrointestinal: Negative for abdominal distention.  Genitourinary: Negative for dysuria, urgency, frequency, hematuria, flank pain, decreased urine volume, difficulty urinating and dyspareunia.  Musculoskeletal: Negative for back pain, joint swelling, arthralgias and gait problem.  Neurological: Negative for dizziness, tremors, seizures, syncope, facial asymmetry, speech difficulty, weakness, light-headedness, numbness and headaches.  Hematological: Negative for adenopathy. Does not bruise/bleed easily.  Psychiatric/Behavioral: Negative for hallucinations, behavioral problems, confusion, dysphoric mood, decreased concentration and agitation.    Objective:   Filed Vitals:   05/06/13 1026  BP: 127/82  Pulse: 67  Temp: 98.7 F (37.1 C)  Resp: 16    Physical Exam  Constitutional: Appears well-developed and well-nourished. No distress.  HENT: Normocephalic. External right and left ear normal. Oropharynx is clear and moist.  Eyes: Conjunctivae and EOM are normal. PERRLA, no scleral icterus.  Neck: Normal ROM. Neck supple. No JVD. No tracheal deviation. No thyromegaly.  CVS: RRR, S1/S2 +, no murmurs, no gallops, no carotid bruit.  Pulmonary: Effort and breath sounds normal, no stridor, rhonchi, wheezes, rales.  Abdominal: Soft. BS +,  no distension, tenderness, rebound or  guarding.  Musculoskeletal: Normal range of motion. No edema and no tenderness.  Lymphadenopathy: No lymphadenopathy noted, cervical, inguinal. Neuro: Alert. Normal reflexes, muscle tone coordination. No cranial nerve deficit. Skin: Skin is warm and dry. No rash noted. Not diaphoretic. No erythema. No pallor.  Psychiatric: Normal mood and affect. Behavior, judgment, thought content normal.    Lab Results  Component Value Date   WBC 7.2 08/03/2011   HGB 12.0 08/03/2011   HCT 35.9* 08/03/2011   MCV 90.9 08/03/2011   PLT 199 08/03/2011   Lab Results  Component Value Date   CREATININE 0.50 08/02/2011   BUN 8 08/02/2011   NA 136 08/02/2011   K 3.6 08/02/2011   CL 104 08/02/2011   CO2 21 08/02/2011    No results found for this basename: HGBA1C   Lipid Panel  No results found for this basename: chol, trig, hdl, cholhdl, vldl, ldlcalc       Assessment and plan:   Patient Active Problem List   Diagnosis Date Noted  . Prolonged pregnancy 08/02/2011  . Normal delivery 08/02/2011  . Hypertension complicating pregnancy, childbirth, and the puerperium 07/27/2011       Secondary amenorrhea check TSH prolactin Quantitative beta hCG Pap smear done   Establish care Baseline labs including lipid panel, hemoglobin A1c, CBC, CMP  Followup in 2 weeks

## 2013-05-06 NOTE — Progress Notes (Signed)
Patient states here for physical and pap smear Has not had a period in 5 months  Stated she is breast feeding and no chance she is pregnant

## 2013-05-07 LAB — VITAMIN D 25 HYDROXY (VIT D DEFICIENCY, FRACTURES): Vit D, 25-Hydroxy: 32 ng/mL (ref 30–89)

## 2013-05-07 LAB — PROLACTIN: Prolactin: 21.2 ng/mL

## 2013-05-07 LAB — URINALYSIS W MICROSCOPIC + REFLEX CULTURE
Bilirubin Urine: NEGATIVE
Casts: NONE SEEN
Crystals: NONE SEEN
Glucose, UA: NEGATIVE mg/dL
Leukocytes, UA: NEGATIVE
Protein, ur: NEGATIVE mg/dL
Specific Gravity, Urine: 1.027 (ref 1.005–1.030)
Squamous Epithelial / LPF: NONE SEEN
pH: 5.5 (ref 5.0–8.0)

## 2013-05-12 ENCOUNTER — Encounter: Payer: Self-pay | Admitting: Obstetrics & Gynecology

## 2013-05-14 ENCOUNTER — Telehealth: Payer: Self-pay | Admitting: Internal Medicine

## 2013-05-14 NOTE — Telephone Encounter (Signed)
05/14/13  Patient called requesting lab results Dr. Susie Cassette please review labs dated 05/06/13 and  Make recommendations. Thanks P.[facility] BSN  MHA

## 2013-05-14 NOTE — Telephone Encounter (Signed)
Pt has not been called back about lab results.  Also pt has a medication question she would like to discuss with nurse.  Please f/u with pt.

## 2013-05-22 ENCOUNTER — Ambulatory Visit: Payer: No Typology Code available for payment source | Attending: Family Medicine | Admitting: Internal Medicine

## 2013-05-22 VITALS — BP 127/83 | HR 88 | Temp 97.0°F | Resp 16 | Wt 168.0 lb

## 2013-05-22 DIAGNOSIS — Z09 Encounter for follow-up examination after completed treatment for conditions other than malignant neoplasm: Secondary | ICD-10-CM

## 2013-05-22 MED ORDER — PRENATAL PLUS 27-1 MG PO TABS: 1.0000 | ORAL_TABLET | Freq: Every day | ORAL | Status: DC

## 2013-05-22 NOTE — Progress Notes (Signed)
Patient is here for follow uo amennorrhea

## 2013-05-22 NOTE — Progress Notes (Signed)
Patient ID: Becky Cruz, female   DOB: 1970-12-17, 42 y.o.   MRN: [id number]  CC: Followup  HPI: Patient was seen in the clinic today for followup visit. She would like to know the results of her lab especially Pap smear and hormone. She still has not seen her menstrual period. She has no complaint today, she is doing well, still breast-feeding.  No Known Allergies Past Medical History  Diagnosis Date  . Pregnancy induced hypertension   . Anemia   . Dysrhythmia    Current Outpatient Prescriptions on File Prior to Visit  Medication Sig Dispense Refill  . IRON PO Take 1 tablet by mouth daily.        Marland Kitchen loratadine (CLARITIN) 10 MG tablet Take 10 mg by mouth daily.        . Multiple Vitamins-Minerals (MULTIVITAMIN WITH MINERALS) tablet Gelatin free  120 tablet  2   No current facility-administered medications on file prior to visit.   Family History  Problem Relation Age of Onset  . Anesthesia problems Neg Hx   . Hypotension Neg Hx   . Malignant hyperthermia Neg Hx   . Pseudochol deficiency Neg Hx   . Diabetes Maternal Grandmother    History   Social History  . Marital Status: Married    Spouse Name: N/A    Number of Children: N/A  . Years of Education: N/A   Occupational History  . Not on file.   Social History Main Topics  . Smoking status: Never Smoker   . Smokeless tobacco: Never Used  . Alcohol Use: No  . Drug Use: No  . Sexual Activity: Yes    Birth Control/ Protection: None   Other Topics Concern  . Not on file   Social History Narrative  . No narrative on file    Review of Systems: Constitutional: Negative for fever, chills, diaphoresis, activity change, appetite change and fatigue. HENT: Negative for ear pain, nosebleeds, congestion, facial swelling, rhinorrhea, neck pain, neck stiffness and ear discharge.  Eyes: Negative for pain, discharge, redness, itching and visual disturbance. Respiratory: Negative for cough, choking, chest tightness, shortness of  breath, wheezing and stridor.  Cardiovascular: Negative for chest pain, palpitations and leg swelling. Gastrointestinal: Negative for abdominal distention. Genitourinary: Negative for dysuria, urgency, frequency, hematuria, flank pain, decreased urine volume, difficulty urinating and dyspareunia.  Musculoskeletal: Negative for back pain, joint swelling, arthralgias and gait problem. Neurological: Negative for dizziness, tremors, seizures, syncope, facial asymmetry, speech difficulty, weakness, light-headedness, numbness and headaches.  Hematological: Negative for adenopathy. Does not bruise/bleed easily. Psychiatric/Behavioral: Negative for hallucinations, behavioral problems, confusion, dysphoric mood, decreased concentration and agitation.    Objective:   Filed Vitals:   05/22/13 1453  BP: 127/83  Pulse: 88  Temp: 97 F (36.1 C)  Resp: 16    Physical Exam: Constitutional: Patient appears well-developed and well-nourished. No distress. HENT: Normocephalic, atraumatic, External right and left ear normal. Oropharynx is clear and moist.  Eyes: Conjunctivae and EOM are normal. PERRLA, no scleral icterus. Neck: Normal ROM. Neck supple. No JVD. No tracheal deviation. No thyromegaly. CVS: RRR, S1/S2 +, no murmurs, no gallops, no carotid bruit.  Pulmonary: Effort and breath sounds normal, no stridor, rhonchi, wheezes, rales.  Abdominal: Soft. BS +,  no distension, tenderness, rebound or guarding.  Musculoskeletal: Normal range of motion. No edema and no tenderness.  Lymphadenopathy: No lymphadenopathy noted, cervical, inguinal or axillary Neuro: Alert. Normal reflexes, muscle tone coordination. No cranial nerve deficit. Skin: Skin is warm and dry. No  rash noted. Not diaphoretic. No erythema. No pallor. Psychiatric: Normal mood and affect. Behavior, judgment, thought content normal.  Lab Results  Component Value Date   WBC 6.4 05/06/2013   HGB 11.9* 05/06/2013   HCT 36.1 05/06/2013   MCV  78.5 05/06/2013   PLT 281 05/06/2013   Lab Results  Component Value Date   CREATININE 0.65 05/06/2013   BUN 13 05/06/2013   NA 137 05/06/2013   K 3.8 05/06/2013   CL 105 05/06/2013   CO2 26 05/06/2013    No results found for this basename: HGBA1C   Lipid Panel     Component Value Date/Time   CHOL 154 05/06/2013 1050   TRIG 115 05/06/2013 1050   HDL 50 05/06/2013 1050   CHOLHDL 3.1 05/06/2013 1050   VLDL 23 05/06/2013 1050   LDLCALC 81 05/06/2013 1050       Assessment and plan:   Patient Active Problem List   Diagnosis Date Noted  . Follow-up exam 05/22/2013  . Prolonged pregnancy 08/02/2011  . Normal delivery 08/02/2011  . Hypertension complicating pregnancy, childbirth, and the puerperium 07/27/2011   Lab results were discussed with patient Pap smear normal Patient is slightly anemic, continue on iron supplement Prescription for multivitamins given, patient wants to vitamin without gelatin Encouraged to keep her appointment with the gynecologist for secondary amenorrhea even though it looks like it may be as a result of her breast-feeding as all hormonal profile came back normal  Makaylie Dedeaux was given clear instructions to go to ER or return to the clinic if symptoms don't improve, worsen or new problems develop.  Amberlee Mastrianni verbalized understanding.  Anabel Burchfield was told to call to get lab results if hasn't heard anything in the next week.        Jeanann Lewandowsky, MD [facility] [place], [place] [phone]   05/22/2013, 3:43 PM

## 2013-06-18 ENCOUNTER — Encounter: Payer: Self-pay | Admitting: Obstetrics and Gynecology

## 2013-06-18 ENCOUNTER — Ambulatory Visit (INDEPENDENT_AMBULATORY_CARE_PROVIDER_SITE_OTHER): Payer: No Typology Code available for payment source | Admitting: Obstetrics and Gynecology

## 2013-06-18 VITALS — BP 128/88 | HR 101 | Temp 98.0°F | Resp 20 | Ht 63.0 in | Wt 172.1 lb

## 2013-06-18 DIAGNOSIS — N912 Amenorrhea, unspecified: Secondary | ICD-10-CM

## 2013-06-18 NOTE — Progress Notes (Signed)
CC: Referral   HPI Becky Cruz is a 42 y.o. [id number] referred from PMD for amenorrhea. She is breastfeeding her nearly 43 yo daughter 3-4 times per day and more frequently at night and also supplementing. Plans to wear in 1 month. She was amenorrheic for several months when she began breastfeeding. The concern is that she did have return of menses for a few cycles several months ago and then became amenorrheic again. She has had a work up last month with PMD and had quantitative beta hCG <2, normal CMP, normal TSH and normal PL level 05/06/13. She has no subjective sx. Of pregnancy and is not on contraception.   Past Medical History  Diagnosis Date  . Pregnancy induced hypertension   . Anemia   . Dysrhythmia     OB History  Gravida Para Term Preterm AB SAB TAB Ectopic Multiple Living  4 4 4       4     # Outcome Date GA Lbr Len/2nd Weight Sex Delivery Anes PTL Lv  4 TRM 08/02/11 [redacted]w[redacted]d 04:51 / 00:07 7 lb 5.3 oz (3.325 kg) F SVD EPI  Y     Comments: NA  3 TRM 2003 [redacted]w[redacted]d 05:00 9 lb 10 oz (4.366 kg) M SVD EPI  Y  2 TRM 2001 [redacted]w[redacted]d 15:00 7 lb (3.175 kg) M SVD EPI  Y  1 TRM 2001 [redacted]w[redacted]d  6 lb 11 oz (3.033 kg) F SVD EPI  Y      Past Surgical History  Procedure Laterality Date  . Wisdom tooth extraction      History   Social History  . Marital Status: Married    Spouse Name: N/A    Number of Children: N/A  . Years of Education: N/A   Occupational History  . Not on file.   Social History Main Topics  . Smoking status: Never Smoker   . Smokeless tobacco: Never Used  . Alcohol Use: No  . Drug Use: No  . Sexual Activity: Yes    Birth Control/ Protection: None, Condom   Other Topics Concern  . Not on file   Social History Narrative  . No narrative on file    Current Outpatient Prescriptions on File Prior to Visit  Medication Sig Dispense Refill  . loratadine (CLARITIN) 10 MG tablet Take 10 mg by mouth daily.        . prenatal vitamin w/FE, FA (PRENATAL 1 + 1) 27-1 MG TABS tablet  Take 1 tablet by mouth daily.  60 each  6  . IRON PO Take 1 tablet by mouth daily.        . Multiple Vitamins-Minerals (MULTIVITAMIN WITH MINERALS) tablet Gelatin free  120 tablet  2   No current facility-administered medications on file prior to visit.    No Known Allergies  ROS Pertinent items in HPI  PHYSICAL EXAM Filed Vitals:   06/18/13 1559  BP: 128/88  Pulse: 101  Temp: 98 F (36.7 C)  Resp: 20   General: Well nourished, well developed female in no acute distress Cardiovascular: Normal rate Respiratory: Normal effort Abdomen: Soft, nontender, no organomegaly Back: No CVAT Extremities: No edema Neurologic: Alert and oriented     ASSESSMENT  Lactational amenorrhea  PLAN Reassured pt. and advised of LARC options. Advised to check HPT if amenorrheic 6 wks after weaning. Should take PNV 1/d if not going on contraception. F/U here prn for contraception visit.      Danae Orleans, CNM 06/18/2013 5:09 PM

## 2013-06-18 NOTE — Progress Notes (Signed)
Pt has not had a period for 7 months. She is breastfeeding and her child is 41 years old.

## 2013-06-18 NOTE — Patient Instructions (Signed)
Secondary Amenorrhea   Secondary amenorrhea is the stopping of menstrual flow for 3 to 6 months in a female who has previously had periods. There are many possible causes. Most of these causes are not serious. Usually treating the underlying problem causing the loss of menses will return your periods to normal.  CAUSES   Some common and uncommon causes of not menstruating include:  · Malnutrition.  · Low blood sugar (hypoglycemia).  · Polycystic ovarian disease.  · Stress or fear.  · Breastfeeding.  · Hormone imbalance.  · Ovarian failure.  · Medications.  · Extreme obesity.  · Cystic fibrosis.  · Low body weight or drastic weight reduction from any cause.  · Early menopause.  · Removal of ovaries or uterus.  · Contraceptives.  · Illness.  · Long term (chronic) illnesses.  · Cushing's syndrome.  · Thyroid problems.  · Birth control pills, patches, or vaginal rings for birth control.  DIAGNOSIS   This diagnosis is made by your caregiver taking a medical history and doing a physical exam. Pregnancy must be ruled out. Often times, numerous blood tests of different hormones in the body may be measured. Urine testing may be done. Specialized x-rays may have to be done as well as measuring the body mass index (BMI).  TREATMENT   Treatment depends on the cause of the amenorrhea. If an eating disorder is present, this can be treated with an adequate diet and therapy. Chronic illnesses may improve with treatment of the illness. Overall, the outlook is good. The amenorrhea may be corrected with medications, lifestyle changes, or surgery. If the amenorrhea cannot be corrected, it is sometimes possible to create a false menstruation with medications.  Document Released: 10/30/2006 Document Revised: 12/11/2011 Document Reviewed: 09/06/2007  Exit[employer]® Patient Information ©2014 [employer].

## 2013-09-04 ENCOUNTER — Ambulatory Visit: Payer: No Typology Code available for payment source | Attending: Internal Medicine

## 2013-09-04 ENCOUNTER — Encounter: Payer: Self-pay | Admitting: Internal Medicine

## 2013-09-04 ENCOUNTER — Ambulatory Visit: Payer: No Typology Code available for payment source | Attending: Internal Medicine | Admitting: Internal Medicine

## 2013-09-04 VITALS — BP 120/86 | HR 78 | Temp 98.8°F | Resp 16 | Ht 65.0 in | Wt 174.0 lb

## 2013-09-04 DIAGNOSIS — H538 Other visual disturbances: Secondary | ICD-10-CM

## 2013-09-04 NOTE — Progress Notes (Signed)
Patient ID: Becky Cruz, female   DOB: 1971/03/22, 42 y.o.   MRN: [id number] Patient Demographics  Becky Cruz, is a 42 y.o. female  WUJ:[id number]  NFA:[id number]  DOB - 1970-11-14  Chief Complaint  Patient presents with  . Follow-up        Subjective:   Becky Cruz is a 42 y.o. female here today for a follow up visit. Patient claims she has been having difficulty reading her book lately, she wears a glass that she bought from [place] which improves her vision or when she takes it off, prints are blurry. Patient has No headache, No chest pain, No abdominal pain - No Nausea, No new weakness tingling or numbness, No Cough - SOB.  ALLERGIES: No Known Allergies  PAST MEDICAL HISTORY: Past Medical History  Diagnosis Date  . Pregnancy induced hypertension   . Anemia   . Dysrhythmia     MEDICATIONS AT HOME: Prior to Admission medications   Medication Sig Start Date End Date Taking? Authorizing Provider  IRON PO Take 1 tablet by mouth daily.      Historical Provider, MD  loratadine (CLARITIN) 10 MG tablet Take 10 mg by mouth daily.      Historical Provider, MD  Multiple Vitamins-Minerals (MULTIVITAMIN WITH MINERALS) tablet Gelatin free 05/06/13   Richarda Overlie, MD  prenatal vitamin w/FE, FA (PRENATAL 1 + 1) 27-1 MG TABS tablet Take 1 tablet by mouth daily. 05/22/13   Jeanann Lewandowsky, MD     Objective:   Filed Vitals:   09/04/13 1241  BP: 120/86  Pulse: 78  Temp: 98.8 F (37.1 C)  TempSrc: Oral  Resp: 16  Height: 5\' 5"  (1.651 m)  Weight: 174 lb (78.926 kg)  SpO2: 100%    Exam General appearance : Awake, alert, not in any distress. Speech Clear. Not toxic looking Examination restricted because of religious rights, no physical examination done.   Data Review   CBC No results found for this basename: WBC, HGB, HCT, PLT, MCV, MCH, MCHC, RDW, NEUTRABS, LYMPHSABS, MONOABS, EOSABS, BASOSABS, BANDABS, BANDSABD,  in the last 168 hours  Chemistries   No results found for  this basename: NA, K, CL, CO2, GLUCOSE, BUN, CREATININE, GFRCGP, CALCIUM, MG, AST, ALT, ALKPHOS, BILITOT,  in the last 168 hours ------------------------------------------------------------------------------------------------------------------ No results found for this basename: HGBA1C,  in the last 72 hours ------------------------------------------------------------------------------------------------------------------ No results found for this basename: CHOL, HDL, LDLCALC, TRIG, CHOLHDL, LDLDIRECT,  in the last 72 hours ------------------------------------------------------------------------------------------------------------------ No results found for this basename: TSH, T4TOTAL, FREET3, T3FREE, THYROIDAB,  in the last 72 hours ------------------------------------------------------------------------------------------------------------------ No results found for this basename: VITAMINB12, FOLATE, FERRITIN, TIBC, IRON, RETICCTPCT,  in the last 72 hours  Coagulation profile  No results found for this basename: INR, PROTIME,  in the last 168 hours    Assessment & Plan   1. Blurry vision, bilateral  - Ambulatory referral to Optometry   Follow up in 6 months or when necessary   The patient was given clear instructions to go to ER or return to medical center if symptoms don't improve, worsen or new problems develop. The patient verbalized understanding. The patient was told to call to get lab results if they haven't heard anything in the next week.    Jeanann Lewandowsky, MD, MHA, FACP, FAAP [facility] and [facility] [place], [place] [phone]   09/04/2013, 1:16 PM

## 2013-09-04 NOTE — Progress Notes (Signed)
Pt is here today with a C.C. Of having blurry vision and some pain in the eyes.

## 2014-02-06 ENCOUNTER — Ambulatory Visit (INDEPENDENT_AMBULATORY_CARE_PROVIDER_SITE_OTHER): Payer: No Typology Code available for payment source | Admitting: Advanced Practice Midwife

## 2014-02-06 ENCOUNTER — Encounter: Payer: Self-pay | Admitting: Advanced Practice Midwife

## 2014-02-06 VITALS — BP 136/86 | HR 88 | Temp 97.0°F | Wt 168.0 lb

## 2014-02-06 DIAGNOSIS — N911 Secondary amenorrhea: Secondary | ICD-10-CM

## 2014-02-06 DIAGNOSIS — N912 Amenorrhea, unspecified: Secondary | ICD-10-CM

## 2014-02-06 LAB — CBC
HEMATOCRIT: 37.9 % (ref 36.0–46.0)
HEMOGLOBIN: 13 g/dL (ref 12.0–15.0)
MCH: 28.6 pg (ref 26.0–34.0)
MCHC: 34.3 g/dL (ref 30.0–36.0)
MCV: 83.3 fL (ref 78.0–100.0)
Platelets: 246 10*3/uL (ref 150–400)
RBC: 4.55 MIL/uL (ref 3.87–5.11)
RDW: 15 % (ref 11.5–15.5)
WBC: 6.3 10*3/uL (ref 4.0–10.5)

## 2014-02-06 LAB — TSH: TSH: 0.599 u[IU]/mL (ref 0.350–4.500)

## 2014-02-06 LAB — FOLLICLE STIMULATING HORMONE: FSH: 77.8 m[IU]/mL

## 2014-02-06 LAB — PROLACTIN: Prolactin: 2.7 ng/mL

## 2014-02-06 NOTE — Progress Notes (Signed)
Subjective:     Patient ID: Becky Cruz, female   DOB: [birth year], 43 y.o.   MRN: [id number]  HPI 43 y.o. [id number] presents to clinic with amenorrhea >6 months.  She reports return of menses during breastfeeding for her first 3 children when each child was ~38 months of age but she was amenorrheic x2 years during breastfeeding with her last child.  She weaned this child from breastfeeding 6 months ago and has continued to report no onset of menses.  She had one episode of light spotting 3-4 months ago and continues to have no other vaginal bleeding.  She is not taking any medication, only prenatal vitamins, and has no IUD or implantable contraception in place.  She is travelling to [place] to visit family in 2 weeks and is worried that she has cancer or something serious and wants to find out what is going on before she leaves the country.  She plans to return and can resume care in our office at that time.  She denies pain, dizziness, vaginal itching/burning/odor, difficulty urinating or with sexual intercourse, or fever/chills.    Review of Systems Complete ROS was completed and is negative.     Objective:   Physical Exam Physical Examination: General appearance - alert, well appearing, and in no distress, oriented to person, place, and time and acyanotic, in no respiratory distress Pelvic - normal external genitalia, vulva, vagina, cervix, uterus and adnexa, UTERUS: uterus is normal size, shape, consistency and nontender    Assessment:     Secondary amenorrhea vs early menopause     Plan:     Labs today: TSH, FSH, Prolactin, CBC Pelvic ultrasound scheduled for next week If labs/ultrasound normal, reassurance provided that pt can safely travel without immediate concern Plan to f/u in 2-3 months to reevaluate

## 2014-02-09 ENCOUNTER — Ambulatory Visit (HOSPITAL_COMMUNITY)
Admission: RE | Admit: 2014-02-09 | Discharge: 2014-02-09 | Disposition: A | Discharge status: Home / Self Care | Payer: No Typology Code available for payment source | Source: Ambulatory Visit | Attending: Advanced Practice Midwife | Admitting: Advanced Practice Midwife

## 2014-02-09 ENCOUNTER — Other Ambulatory Visit: Payer: Self-pay | Admitting: Advanced Practice Midwife

## 2014-02-09 DIAGNOSIS — N911 Secondary amenorrhea: Secondary | ICD-10-CM

## 2014-02-09 DIAGNOSIS — N912 Amenorrhea, unspecified: Secondary | ICD-10-CM | POA: Insufficient documentation

## 2014-02-10 ENCOUNTER — Ambulatory Visit (HOSPITAL_COMMUNITY): Payer: No Typology Code available for payment source

## 2014-02-10 ENCOUNTER — Telehealth: Payer: Self-pay | Admitting: *Deleted

## 2014-02-10 NOTE — Telephone Encounter (Signed)
Pt called nurse line requesting call back with results, called patient and gave results.  Pt request to speak with Fatima Blank, informed patient I would send her a message through the system.  Pt verbalizes understanding.

## 2014-02-11 ENCOUNTER — Telehealth: Payer: Self-pay | Admitting: Advanced Practice Midwife

## 2014-02-11 NOTE — Telephone Encounter (Signed)
Called pt to review recent labs/ultrasound.  Pt TSH, prolactin, CBC wnl.  FSH 77.8, consistent with menopause.  Labs reviewed with Dr Kennon Rounds prior to phone call.  Likely cause of amenorrhea is early menopause.  Pt to f/u in clinic as needed. Pt stated understanding.

## 2014-02-13 ENCOUNTER — Ambulatory Visit: Payer: No Typology Code available for payment source

## 2014-04-30 ENCOUNTER — Ambulatory Visit: Payer: Self-pay | Attending: Internal Medicine

## 2014-06-04 ENCOUNTER — Other Ambulatory Visit: Payer: Self-pay | Admitting: Internal Medicine

## 2014-08-03 ENCOUNTER — Encounter: Payer: Self-pay | Admitting: Advanced Practice Midwife

## 2016-02-25 DIAGNOSIS — E785 Hyperlipidemia, unspecified: Secondary | ICD-10-CM | POA: Insufficient documentation

## 2016-02-25 DIAGNOSIS — K5900 Constipation, unspecified: Secondary | ICD-10-CM

## 2016-02-25 HISTORY — DX: Constipation, unspecified: K59.00

## 2016-12-08 DIAGNOSIS — I493 Ventricular premature depolarization: Secondary | ICD-10-CM | POA: Insufficient documentation

## 2016-12-08 DIAGNOSIS — R002 Palpitations: Secondary | ICD-10-CM

## 2016-12-08 HISTORY — DX: Palpitations: R00.2

## 2017-01-30 ENCOUNTER — Ambulatory Visit (HOSPITAL_COMMUNITY)
Admission: EM | Admit: 2017-01-30 | Discharge: 2017-01-30 | Disposition: A | Discharge status: Home / Self Care | Payer: BLUE CROSS/BLUE SHIELD | Attending: Family Medicine | Admitting: Family Medicine

## 2017-01-30 ENCOUNTER — Encounter (HOSPITAL_COMMUNITY): Payer: Self-pay | Admitting: Family Medicine

## 2017-01-30 DIAGNOSIS — J069 Acute upper respiratory infection, unspecified: Secondary | ICD-10-CM | POA: Diagnosis not present

## 2017-01-30 DIAGNOSIS — B9789 Other viral agents as the cause of diseases classified elsewhere: Secondary | ICD-10-CM | POA: Diagnosis not present

## 2017-01-30 MED ORDER — METHYLPREDNISOLONE 4 MG PO TBPK: ORAL_TABLET | ORAL | 0 refills | Status: DC

## 2017-01-30 MED ORDER — IPRATROPIUM BROMIDE 0.06 % NA SOLN: 2.0000 | Freq: Four times a day (QID) | NASAL | 0 refills | Status: DC

## 2017-01-30 MED ORDER — BENZONATATE 100 MG PO CAPS: 100.0000 mg | ORAL_CAPSULE | Freq: Three times a day (TID) | ORAL | 0 refills | Status: DC

## 2017-01-30 NOTE — ED Triage Notes (Addendum)
Pt here for URI symptoms.  

## 2017-01-30 NOTE — ED Provider Notes (Signed)
CSN: [id number]     Arrival date & time 01/30/17  2000 History   First MD Initiated Contact with Patient 01/30/17 2034     Chief Complaint  Patient presents with  . Cough  . Nasal Congestion  . Shortness of Breath   (Consider location/radiation/quality/duration/timing/severity/associated sxs/prior Treatment) Patient c/o cough and uri sx's.   The history is provided by the patient.  Cough  Cough characteristics:  Productive Sputum characteristics:  White Severity:  Moderate Onset quality:  Sudden Duration:  3 days Timing:  Constant Chronicity:  New Relieved by:  Nothing Worsened by:  Nothing Ineffective treatments:  None tried Associated symptoms: rhinorrhea and shortness of breath   Shortness of Breath  Associated symptoms: cough     Past Medical History:  Diagnosis Date  . Anemia   . Dysrhythmia   . Pregnancy induced hypertension    Past Surgical History:  Procedure Laterality Date  . WISDOM TOOTH EXTRACTION     Family History  Problem Relation Age of Onset  . Diabetes Maternal Grandmother   . Anesthesia problems Neg Hx   . Hypotension Neg Hx   . Malignant hyperthermia Neg Hx   . Pseudochol deficiency Neg Hx    Social History  Substance Use Topics  . Smoking status: Never Smoker  . Smokeless tobacco: Never Used  . Alcohol use No   OB History    Gravida Para Term Preterm AB Living   4 4 4     4    SAB TAB Ectopic Multiple Live Births           4     Review of Systems  Constitutional: Positive for fatigue.  HENT: Positive for rhinorrhea.   Respiratory: Positive for cough and shortness of breath.   Cardiovascular: Negative.   Gastrointestinal: Negative.   Endocrine: Negative.   Genitourinary: Negative.   Musculoskeletal: Negative.   Allergic/Immunologic: Negative.   Neurological: Negative.   Hematological: Negative.   Psychiatric/Behavioral: Negative.     Allergies  Patient has no known allergies.  Home Medications   Prior to Admission  medications   Medication Sig Start Date End Date Taking? Authorizing Provider  benzonatate (TESSALON) 100 MG capsule Take 1 capsule (100 mg total) by mouth every 8 (eight) hours. 01/30/17   Lysbeth Penner, FNP  ipratropium (ATROVENT) 0.06 % nasal spray Place 2 sprays into both nostrils 4 (four) times daily. 01/30/17   Lysbeth Penner, FNP  IRON PO Take 1 tablet by mouth daily.      Historical Provider, MD  loratadine (CLARITIN) 10 MG tablet Take 10 mg by mouth daily.      Historical Provider, MD  methylPREDNISolone (MEDROL DOSEPAK) 4 MG TBPK tablet Take 6-5-4-3-2-1 po qd 01/30/17   Lysbeth Penner, FNP  Multiple Vitamins-Minerals (MULTIVITAMIN WITH MINERALS) tablet Gelatin free 05/06/13   Reyne Dumas, MD  prenatal vitamin w/FE, FA (PRENATAL 1 + 1) 27-1 MG TABS tablet Take 1 tablet by mouth daily. 05/22/13   Tresa Garter, MD   Meds Ordered and Administered this Visit  Medications - No data to display  BP (!) 93/58   Pulse 86   Temp 98.4 F (36.9 C)   Resp 18   SpO2 97%  No data found.   Physical Exam  Constitutional: She appears well-developed and well-nourished.  HENT:  Head: Normocephalic.  Right Ear: External ear normal.  Left Ear: External ear normal.  Mouth/Throat: Oropharynx is clear and moist.  Eyes: Conjunctivae and EOM are normal. Pupils  are equal, round, and reactive to light.  Neck: Normal range of motion. Neck supple.  Cardiovascular: Normal rate, regular rhythm and normal heart sounds.   Pulmonary/Chest: Effort normal. She has wheezes.  Nursing note and vitals reviewed.   Urgent Care Course     Procedures (including critical care time)  Labs Review Labs Reviewed - No data to display  Imaging Review No results found.   Visual Acuity Review  Right Eye Distance:   Left Eye Distance:   Bilateral Distance:    Right Eye Near:   Left Eye Near:    Bilateral Near:         MDM   1. Viral URI with cough    Medrol dose pack atrovent nasal  spray Tessalon perles  Push po fluids, rest, tylenol and motrin otc prn as directed for fever, arthralgias, and myalgias.  Follow up prn if sx's continue or persist.    Lysbeth Penner, FNP 01/30/17 2054

## 2017-06-12 DIAGNOSIS — Z9109 Other allergy status, other than to drugs and biological substances: Secondary | ICD-10-CM | POA: Insufficient documentation

## 2017-11-19 ENCOUNTER — Ambulatory Visit: Payer: BLUE CROSS/BLUE SHIELD | Admitting: Family Medicine

## 2017-11-19 ENCOUNTER — Other Ambulatory Visit: Payer: Self-pay

## 2017-11-19 ENCOUNTER — Encounter: Payer: Self-pay | Admitting: Family Medicine

## 2017-11-19 VITALS — BP 123/84 | HR 76 | Temp 98.3°F | Wt 172.2 lb

## 2017-11-19 DIAGNOSIS — E28319 Asymptomatic premature menopause: Secondary | ICD-10-CM | POA: Diagnosis not present

## 2017-11-19 DIAGNOSIS — R011 Cardiac murmur, unspecified: Secondary | ICD-10-CM

## 2017-11-19 DIAGNOSIS — R42 Dizziness and giddiness: Secondary | ICD-10-CM

## 2017-11-19 DIAGNOSIS — R102 Pelvic and perineal pain: Secondary | ICD-10-CM | POA: Diagnosis not present

## 2017-11-19 DIAGNOSIS — Z124 Encounter for screening for malignant neoplasm of cervix: Secondary | ICD-10-CM

## 2017-11-19 DIAGNOSIS — R109 Unspecified abdominal pain: Secondary | ICD-10-CM

## 2017-11-19 LAB — POC HEMOCCULT BLD/STL (OFFICE/1-CARD/DIAGNOSTIC): Fecal Occult Blood, POC: NEGATIVE

## 2017-11-19 LAB — POCT CBC
Granulocyte percent: 55.3 %G (ref 37–80)
HCT, POC: 40.5 % (ref 37.7–47.9)
Hemoglobin: 13.4 g/dL (ref 12.2–16.2)
Lymph, poc: 1.6 (ref 0.6–3.4)
MCH, POC: 28.8 pg (ref 27–31.2)
MCHC: 33.1 g/dL (ref 31.8–35.4)
MCV: 87.1 fL (ref 80–97)
MID (cbc): 0.5 (ref 0–0.9)
MPV: 8.7 fL (ref 0–99.8)
POC Granulocyte: 2.6 (ref 2–6.9)
POC LYMPH %: 34 % (ref 10–50)
POC MID %: 10.7 %M (ref 0–12)
Platelet Count, POC: 225 10*3/uL (ref 142–424)
RBC: 4.65 M/uL (ref 4.04–5.48)
RDW, POC: 14 %
WBC: 4.7 10*3/uL (ref 4.6–10.2)

## 2017-11-19 LAB — POCT URINALYSIS DIP (MANUAL ENTRY)
Bilirubin, UA: NEGATIVE
Glucose, UA: NEGATIVE mg/dL
Ketones, POC UA: NEGATIVE mg/dL
Leukocytes, UA: NEGATIVE
Nitrite, UA: NEGATIVE
Protein Ur, POC: NEGATIVE mg/dL
Spec Grav, UA: <=1.005 — AB (ref 1.010–1.025)
UROBILINOGEN UA: 0.2 U/dL
pH, UA: 5.5 (ref 5.0–8.0)

## 2017-11-19 LAB — COMPREHENSIVE METABOLIC PANEL
A/G RATIO: 1.4 (ref 1.2–2.2)
ALBUMIN: 4.4 g/dL (ref 3.5–5.5)
ALK PHOS: 83 IU/L (ref 39–117)
ALT: 25 IU/L (ref 0–32)
AST: 17 IU/L (ref 0–40)
BUN / CREAT RATIO: 16 (ref 9–23)
BUN: 13 mg/dL (ref 6–24)
Bilirubin Total: 0.5 mg/dL (ref 0.0–1.2)
CHLORIDE: 107 mmol/L — AB (ref 96–106)
CO2: 23 mmol/L (ref 20–29)
Calcium: 9.5 mg/dL (ref 8.7–10.2)
Creatinine, Ser: 0.82 mg/dL (ref 0.57–1.00)
GFR calc Af Amer: 100 mL/min/{1.73_m2} (ref >59)
GFR calc non Af Amer: 87 mL/min/{1.73_m2} (ref >59)
Globulin, Total: 3.2 g/dL (ref 1.5–4.5)
Glucose: 100 mg/dL — ABNORMAL HIGH (ref 65–99)
POTASSIUM: 3.7 mmol/L (ref 3.5–5.2)
Sodium: 144 mmol/L (ref 134–144)
Total Protein: 7.6 g/dL (ref 6.0–8.5)

## 2017-11-19 LAB — POCT WET + KOH PREP
TRICH BY WET PREP: ABSENT
YEAST BY WET PREP: ABSENT
Yeast by KOH: ABSENT

## 2017-11-19 LAB — POC MICROSCOPIC URINALYSIS (UMFC): Mucus: ABSENT

## 2017-11-19 LAB — POCT URINE PREGNANCY: Preg Test, Ur: NEGATIVE

## 2017-11-19 MED ORDER — METRONIDAZOLE 500 MG PO TABS: 500.0000 mg | ORAL_TABLET | Freq: Two times a day (BID) | ORAL | 0 refills | Status: DC

## 2017-11-19 NOTE — Patient Instructions (Addendum)
   IF you received an x-ray today, you will receive an invoice from [facility]. Please contact [facility] at [phone] with questions or concerns regarding your invoice.   IF you received labwork today, you will receive an invoice from [facility]. Please contact [facility] at [phone] with questions or concerns regarding your invoice.   Our billing staff will not be able to assist you with questions regarding bills from these companies.  You will be contacted with the lab results as soon as they are available. The fastest way to get your results is to activate your My Chart account. Instructions are located on the last page of this paperwork. If you have not heard from us regarding the results in 2 weeks, please contact this office.      Pelvic Pain, Female Pelvic pain is pain in your lower abdomen, below your belly button and between your hips. The pain may start suddenly (acute), keep coming back (recurring), or last a long time (chronic). Pelvic pain that lasts longer than six months is considered chronic. Pelvic pain may affect your:  Reproductive organs.  Urinary system.  Digestive tract.  Musculoskeletal system.  There are many potential causes of pelvic pain. Sometimes, the pain can be a result of digestive or urinary conditions, strained muscles or ligaments, or even reproductive conditions. Sometimes the cause of pelvic pain is not known. Follow these instructions at home:  Take over-the-counter and prescription medicines only as told by your health care provider.  Rest as told by your health care provider.  Do not have sex it if hurts.  Keep a journal of your pelvic pain. Write down: ? When the pain started. ? Where the pain is located. ? What seems to make the pain better or worse, such as food or your menstrual cycle. ? Any symptoms you have along with the pain.  Keep all follow-up visits as told by your health care provider. This is  important. Contact a health care provider if:  Medicine does not help your pain.  Your pain comes back.  You have new symptoms.  You have abnormal vaginal discharge or bleeding, including bleeding after menopause.  You have a fever or chills.  You are constipated.  You have blood in your urine or stool.  You have foul-smelling urine.  You feel weak or lightheaded. Get help right away if:  You have sudden severe pain.  Your pain gets steadily worse.  You have severe pain along with fever, nausea, vomiting, or excessive sweating.  You lose consciousness. This information is not intended to replace advice given to you by your health care provider. Make sure you discuss any questions you have with your health care provider. Document Released: 08/15/2004 Document Revised: 10/13/2015 Document Reviewed: 07/09/2015 Elsevier Interactive Patient Education  2018 [employer]  

## 2017-11-19 NOTE — Progress Notes (Addendum)
Subjective:  This chart was scribed for Shawnee Knapp, MD by Tamsen Roers, at [place] [facility].  This patient was seen in room 1 and the patient's care was started at 11:45 AM.   Chief Complaint  Patient presents with  . Establish Care    abdominal pain after using the bathroom , kidney hurts      Patient ID: Becky Cruz, female    DOB: 09/24/1971, 47 y.o.   MRN: [id number]  HPI HPI Comments: Becky Cruz is a 47 y.o. female who presents to Primary Care at [facility] to establish care. Patient is complaining of intermittent lower abdominal pain with relief after bladder/ bowel movements which started about 4 months ago. She has associated symptoms of left flank pain. She saw her PCP (up the street) and was given antibiotics as well as pain medication as they suspected her to have a urinary tract infection but didn't find any relief so she did not take the pain medication. She denies having an ultrasound completed at that visit. Her last pap smear was about 2-3 years ago.  She did have a pap smear which appeared to be abnormal after her last birth but the follow up revealed that there were no abnormalities.  She has not been mensurating since 2016 ( last child was born 2012) .  Denies any vaginal discharge or bleeding.  Patient has an 49, 54, 64 and 46 year old. Denies constipation, diarrhea, abnormal bowel/bladder movements.   Patient has seen a cardiologist in the past as she would have palpitations from time to time.  She no longer has these symptoms.   Patient has been having frequent dizziness this past year.  She has had annually for the past several years but worse this year.  She has seen her prior PCP in the past regarding this issue and was told that it may be vertigo.     Past Medical History:  Diagnosis Date  . Anemia   . Dysrhythmia   . Pregnancy induced hypertension     Current Outpatient Medications on File Prior to Visit  Medication Sig Dispense Refill  .  albuterol (PROVENTIL HFA;VENTOLIN HFA) 108 (90 Base) MCG/ACT inhaler Inhale 2 puffs into the lungs every 6 (six) hours as needed for wheezing or shortness of breath.    . montelukast (SINGULAIR) 10 MG tablet   4  . Multiple Vitamins-Minerals (MULTIVITAMIN WITH MINERALS) tablet Gelatin free 120 tablet 2  . benzonatate (TESSALON) 100 MG capsule Take 1 capsule (100 mg total) by mouth every 8 (eight) hours. (Patient not taking: Reported on 11/19/2017) 21 capsule 0  . ipratropium (ATROVENT) 0.06 % nasal spray Place 2 sprays into both nostrils 4 (four) times daily. (Patient not taking: Reported on 11/19/2017) 15 mL 0  . IRON PO Take 1 tablet by mouth daily.      Marland Kitchen loratadine (CLARITIN) 10 MG tablet Take 10 mg by mouth daily.      . methylPREDNISolone (MEDROL DOSEPAK) 4 MG TBPK tablet Take 6-5-4-3-2-1 po qd (Patient not taking: Reported on 11/19/2017) 21 tablet 0  . prenatal vitamin w/FE, FA (PRENATAL 1 + 1) 27-1 MG TABS tablet Take 1 tablet by mouth daily. (Patient not taking: Reported on 11/19/2017) 60 each 6   No current facility-administered medications on file prior to visit.     No Known Allergies  Past Surgical History:  Procedure Laterality Date  . WISDOM TOOTH EXTRACTION     Family History  Problem Relation Age  of Onset  . Diabetes Maternal Grandmother   . Anesthesia problems Neg Hx   . Hypotension Neg Hx   . Malignant hyperthermia Neg Hx   . Pseudochol deficiency Neg Hx    Social History   Socioeconomic History  . Marital status: Married    Spouse name: None  . Number of children: None  . Years of education: None  . Highest education level: None  Social Needs  . Financial resource strain: None  . Food insecurity - worry: None  . Food insecurity - inability: None  . Transportation needs - medical: None  . Transportation needs - non-medical: None  Occupational History  . None  Tobacco Use  . Smoking status: Never Smoker  . Smokeless tobacco: Never Used  Substance and Sexual  Activity  . Alcohol use: No  . Drug use: No  . Sexual activity: Yes    Birth control/protection: None, Condom  Other Topics Concern  . None  Social History Narrative  . None   No flowsheet data found.     Review of Systems  Constitutional: Negative for chills and fever.  Eyes: Negative for pain, redness and itching.  Gastrointestinal: Positive for abdominal pain. Negative for constipation, diarrhea, nausea and vomiting.  Endocrine: Negative for polyuria.  Genitourinary: Positive for flank pain (left), menstrual problem (early menopause. no menstrual bleeding since prior to 2015) and pelvic pain. Negative for decreased urine volume, difficulty urinating, dysuria, enuresis, frequency, genital sores, hematuria, urgency, vaginal bleeding, vaginal discharge and vaginal pain.  Musculoskeletal: Negative for arthralgias, back pain, gait problem and joint swelling.       Flank pain  Allergic/Immunologic: Negative for immunocompromised state.  Neurological: Positive for dizziness. Negative for syncope and speech difficulty.       Objective:   Physical Exam  Constitutional: She is oriented to person, place, and time. She appears well-developed and well-nourished. No distress.  HENT:  Head: Normocephalic and atraumatic.  Right Ear: External ear normal.  Left Ear: External ear normal.  Eyes: Conjunctivae are normal. No scleral icterus.  Neck: Normal range of motion. Neck supple. No thyromegaly present.  Cardiovascular: Normal rate, regular rhythm and intact distal pulses.  Murmur heard.  Decrescendo systolic murmur is present with a grade of 2/6. 2/6 systolic ejection murmur on left upper sternal border.   Pulmonary/Chest: Effort normal and breath sounds normal. No respiratory distress. She has no wheezes. She has no rales.  Abdominal: Soft. Normal appearance and bowel sounds are normal. She exhibits no mass. There is no hepatosplenomegaly. There is tenderness in the right lower quadrant,  suprapubic area and left lower quadrant. There is no rigidity, no rebound, no guarding, no CVA tenderness, no tenderness at McBurney's point and negative Murphy's sign.  Mild tenderness in suprapubic and bilateral lower quadrants, most on left quadrant.   Genitourinary: Rectal exam shows tenderness. Rectal exam shows no external hemorrhoid, no fissure, anal tone normal and guaiac negative stool. There is no rash, tenderness or lesion on the right labia. There is no rash, tenderness or lesion on the left labia. Uterus is enlarged, fixed and tender. Cervix exhibits no motion tenderness, no discharge and no friability. Right adnexum displays no mass, no tenderness and no fullness. Left adnexum displays tenderness and fullness. Left adnexum displays no mass. No erythema in the vagina. No vaginal discharge found.  Genitourinary Comments: Pelvic exam: uterine and left adnexal fullness and tenderness. Positive strong vaginitis/amine odor.    Musculoskeletal: She exhibits no edema.  Lymphadenopathy:  She has no cervical adenopathy.       Right: No inguinal adenopathy present.       Left: No inguinal adenopathy present.  Neurological: She is alert and oriented to person, place, and time.  Skin: Skin is warm and dry. She is not diaphoretic. No erythema.  Psychiatric: She has a normal mood and affect. Her behavior is normal.   EKG: normal sinus rhythm.  Few flipped P's in AVl. V1,12, no prior EKGs available for comparison.   Vitals:   11/19/17 1139  BP: 123/84  Pulse: 76  Temp: 98.3 F (36.8 C)  SpO2: 98%  Weight: 172 lb 3.2 oz (78.1 kg)     Results for orders placed or performed in visit on 11/19/17  POCT urinalysis dipstick  Result Value Ref Range   Color, UA yellow yellow   Clarity, UA clear clear   Glucose, UA negative negative mg/dL   Bilirubin, UA negative negative   Ketones, POC UA negative negative mg/dL   Spec Grav, UA <=1.005 (A) 1.010 - 1.025   Blood, UA small (A) negative   pH,  UA 5.5 5.0 - 8.0   Protein Ur, POC negative negative mg/dL   Urobilinogen, UA 0.2 0.2 or 1.0 E.U./dL   Nitrite, UA Negative Negative   Leukocytes, UA Negative Negative  POCT Microscopic Urinalysis ([facility])  Result Value Ref Range   WBC,UR,HPF,POC None None WBC/hpf   RBC,UR,HPF,POC None None RBC/hpf   Bacteria None None, Too numerous to count   Mucus Absent Absent   Epithelial Cells, UR Per Microscopy None None, Too numerous to count cells/hpf  POCT CBC  Result Value Ref Range   WBC 4.7 4.6 - 10.2 K/uL   Lymph, poc 1.6 0.6 - 3.4   POC LYMPH PERCENT 34.0 10 - 50 %L   MID (cbc) 0.5 0 - 0.9   POC MID % 10.7 0 - 12 %M   POC Granulocyte 2.6 2 - 6.9   Granulocyte percent 55.3 37 - 80 %G   RBC 4.65 4.04 - 5.48 M/uL   Hemoglobin 13.4 12.2 - 16.2 g/dL   HCT, POC 40.5 37.7 - 47.9 %   MCV 87.1 80 - 97 fL   MCH, POC 28.8 27 - 31.2 pg   MCHC 33.1 31.8 - 35.4 g/dL   RDW, POC 14.0 %   Platelet Count, POC 225 142 - 424 K/uL   MPV 8.7 0 - 99.8 fL  POCT Wet + KOH Prep  Result Value Ref Range   Yeast by KOH Absent Absent   Yeast by wet prep Absent Absent   WBC by wet prep Few Few   Clue Cells Wet Prep HPF POC None None   Trich by wet prep Absent Absent   Bacteria Wet Prep HPF POC Few Few   Epithelial Cells By [employer] Pref ([facility]) Few None, Few, Too numerous to count   RBC,UR,HPF,POC None None RBC/hpf  POCT urine pregnancy  Result Value Ref Range   Preg Test, Ur Negative Negative  POC Hemoccult Bld/Stl (1-Cd Office Dx)  Result Value Ref Range   Card #1 Date     Fecal Occult Blood, POC Negative Negative       Assessment & Plan:   1. Flank pain, acute   2. Acute abdominal pain   3. Screening for cervical cancer   4. Pelvic pain - uterine and left adnexal pain and fullness on exam. 4 mos of lower pelvic pain that improves after passing stool or urine -  concerning for ovarian or uterine enlargement/obstruction. No obvious cause so needs pelvic US - will do at [facility] so can be compared to  prior done there 4 yrs ago. Will do empiric trial of flagyl as + strong BV/amine odor on exam.  Recheck in OV after [place] to review results and discuss next steps.  5. Vertigo - pt mentions annually recurrent vertigo at end of visit, worse this year. Ok to cont prn meclizine. Can discuss further at f/u, consider referral to ENT vs vestibular PT.  6. Newly recognized heart murmur - EKG nml - ? Intermittent h/o - review again at f/u. Saw cardoilogy in past for even monitor but palpitations now fully resolved.  7. Early menopause occurring in patient age younger than 10 years - Pt was seen at [place] [facility] in 2015 and records confirm that after lab and pelvic US, she was diagnosed as post-menopausal at 47 yo as soon as she stopped breastfeeding her youngest child, told to f/u prn.    Orders Placed This Encounter  Procedures  . Urine Culture  . US Transvaginal Non-OB    Standing Status:   Future    Standing Expiration Date:   01/18/2019    Order Specific Question:   Reason for Exam (SYMPTOM  OR DIAGNOSIS REQUIRED)    Answer:   pelvic pain x 4 mos, uterine and left adnexal tenderness on exam    Order Specific Question:   Preferred imaging location?    Answer:   [facility]  . US Pelvis Complete    Standing Status:   Future    Standing Expiration Date:   01/18/2019    Order Specific Question:   Reason for Exam (SYMPTOM  OR DIAGNOSIS REQUIRED)    Answer:   uterine and left adnexal tenderness on exam x 4 mos    Order Specific Question:   Preferred imaging location?    Answer:   [facility]  . Comprehensive metabolic panel  . POCT urinalysis dipstick  . POCT Microscopic Urinalysis ([facility])  . POCT CBC  . POCT Wet + KOH Prep  . POCT urine pregnancy  . POC Hemoccult Bld/Stl (1-Cd Office Dx)  . EKG 12-Lead    Meds ordered this encounter  Medications  . metroNIDAZOLE (FLAGYL) 500 MG tablet    Sig: Take 1 tablet (500 mg total) by mouth 2 (two) times daily.    Dispense:  14 tablet      Refill:  0    I personally performed the services described in this documentation, which was scribed in my presence. The recorded information has been reviewed and considered, and addended by me as needed.   Delman Cheadle, M.D.  Primary Care at [facility] [address] [place], [place] [address] [phone] phone [phone] fax  11/20/17 10:18 PM

## 2017-11-20 ENCOUNTER — Encounter: Payer: Self-pay | Admitting: Family Medicine

## 2017-11-20 LAB — URINE CULTURE: ORGANISM ID, BACTERIA: NO GROWTH

## 2017-11-21 LAB — PAP IG, CT-NG NAA, HPV HIGH-RISK
Chlamydia, Nuc. Acid Amp: NEGATIVE
Gonococcus by Nucleic Acid Amp: NEGATIVE
HPV, high-risk: NEGATIVE
PAP Smear Comment: 0

## 2017-11-28 ENCOUNTER — Ambulatory Visit (HOSPITAL_COMMUNITY)
Admission: RE | Admit: 2017-11-28 | Discharge: 2017-11-28 | Disposition: A | Discharge status: Home / Self Care | Payer: BLUE CROSS/BLUE SHIELD | Source: Ambulatory Visit | Attending: Family Medicine | Admitting: Family Medicine

## 2017-11-28 DIAGNOSIS — R102 Pelvic and perineal pain: Secondary | ICD-10-CM

## 2017-12-18 ENCOUNTER — Encounter: Payer: Self-pay | Admitting: Family Medicine

## 2017-12-20 ENCOUNTER — Encounter: Payer: Self-pay | Admitting: Family Medicine

## 2017-12-20 ENCOUNTER — Ambulatory Visit: Payer: BLUE CROSS/BLUE SHIELD | Admitting: Family Medicine

## 2017-12-20 ENCOUNTER — Other Ambulatory Visit: Payer: Self-pay

## 2017-12-20 VITALS — BP 116/78 | HR 80 | Temp 98.6°F | Resp 18 | Ht 65.0 in | Wt 171.4 lb

## 2017-12-20 DIAGNOSIS — R739 Hyperglycemia, unspecified: Secondary | ICD-10-CM | POA: Diagnosis not present

## 2017-12-20 DIAGNOSIS — E559 Vitamin D deficiency, unspecified: Secondary | ICD-10-CM

## 2017-12-20 DIAGNOSIS — G8929 Other chronic pain: Secondary | ICD-10-CM

## 2017-12-20 DIAGNOSIS — M545 Low back pain, unspecified: Secondary | ICD-10-CM

## 2017-12-20 LAB — HEMOGLOBIN A1C
ESTIMATED AVERAGE GLUCOSE: 126 mg/dL
Hgb A1c MFr Bld: 6 % — ABNORMAL HIGH (ref 4.8–5.6)

## 2017-12-20 NOTE — Patient Instructions (Addendum)
IF you received an x-ray today, you will receive an invoice from [facility]. Please contact [facility] at [phone] with questions or concerns regarding your invoice.   IF you received labwork today, you will receive an invoice from [place]. Please contact [facility] at [phone] with questions or concerns regarding your invoice.   Our billing staff will not be able to assist you with questions regarding bills from these companies.  You will be contacted with the lab results as soon as they are available. The fastest way to get your results is to activate your My Chart account. Instructions are located on the last page of this paperwork. If you have not heard from [place] regarding the results in 2 weeks, please contact this office.     Low Back Rehab Ask your health care provider which exercises are safe for you. Do exercises exactly as told by your health care provider and adjust them as directed. It is normal to feel mild stretching, pulling, tightness, or discomfort as you do these exercises, but you should stop right away if you feel sudden pain or your pain gets worse. Do not begin these exercises until told by your health care provider. Stretching and range of motion exercises These exercises warm up your muscles and joints and improve the movement and flexibility of your back. These exercises also help to relieve pain, numbness, and tingling. Exercise A: Single knee to chest  1. Lie on your back on a firm surface with both legs straight. 2. Bend one of your knees. Use your hands to move your knee up toward your chest until you feel a gentle stretch in your lower back and buttock. ? Hold your leg in this position by holding onto the front of your knee. ? Keep your other leg as straight as possible. 3. Hold for __________ seconds. 4. Slowly return to the starting position. 5. Repeat with your other leg. Repeat __________ times. Complete this exercise  __________ times a day. Exercise B: Prone extension on elbows  1. Lie on your abdomen on a firm surface. 2. Prop yourself up on your elbows. 3. Use your arms to help lift your chest up until you feel a gentle stretch in your abdomen and your lower back. ? This will place some of your body weight on your elbows. If this is uncomfortable, try stacking pillows under your chest. ? Your hips should stay down, against the surface that you are lying on. Keep your hip and back muscles relaxed. 4. Hold for __________ seconds. 5. Slowly relax your upper body and return to the starting position. Repeat __________ times. Complete this exercise __________ times a day. Strengthening exercises These exercises build strength and endurance in your back. Endurance is the ability to use your muscles for a long time, even after they get tired. Exercise C: Pelvic tilt 1. Lie on your back on a firm surface. Bend your knees and keep your feet flat. 2. Tense your abdominal muscles. Tip your pelvis up toward the ceiling and flatten your lower back into the floor. ? To help with this exercise, you may place a small towel under your lower back and try to push your back into the towel. 3. Hold for __________ seconds. 4. Let your muscles relax completely before you repeat this exercise. Repeat __________ times. Complete this exercise __________ times a day. Exercise D: Alternating arm and leg raises  1. Get on your hands and knees on a firm surface. If you  are on a hard floor, you may want to use padding to cushion your knees, such as an exercise mat. 2. Line up your arms and legs. Your hands should be below your shoulders, and your knees should be below your hips. 3. Lift your left leg behind you. At the same time, raise your right arm and straighten it in front of you. ? Do not lift your leg higher than your hip. ? Do not lift your arm higher than your shoulder. ? Keep your abdominal and back muscles tight. ? Keep  your hips facing the ground. ? Do not arch your back. ? Keep your balance carefully, and do not hold your breath. 4. Hold for __________ seconds. 5. Slowly return to the starting position and repeat with your right leg and your left arm. Repeat __________ times. Complete this exercise __________times a day. Exercise J: Single leg lower with bent knees 1. Lie on your back on a firm surface. 2. Tense your abdominal muscles and lift your feet off the floor, one foot at a time, so your knees and hips are bent in an "L" shape (at about 90 degrees). ? Your knees should be over your hips and your lower legs should be parallel to the floor. 3. Keeping your abdominal muscles tense and your knee bent, slowly lower one of your legs so your toe touches the ground. 4. Lift your leg back up to return to the starting position. ? Do not hold your breath. ? Do not let your back arch. Keep your back flat against the ground. 5. Repeat with your other leg. Repeat __________ times. Complete this exercise __________ times a day. Posture and body mechanics  Body mechanics refers to the movements and positions of your body while you do your daily activities. Posture is part of body mechanics. Good posture and healthy body mechanics can help to relieve stress in your body's tissues and joints. Good posture means that your spine is in its natural S-curve position (your spine is neutral), your shoulders are pulled back slightly, and your head is not tipped forward. The following are general guidelines for applying improved posture and body mechanics to your everyday activities. Standing   When standing, keep your spine neutral and your feet about hip-width apart. Keep a slight bend in your knees. Your ears, shoulders, and hips should line up.  When you do a task in which you stand in one place for a long time, place one foot up on a stable object that is 2-4 inches (5-10 cm) high, such as a footstool. This helps keep  your spine neutral. Sitting   When sitting, keep your spine neutral and keep your feet flat on the floor. Use a footrest, if necessary, and keep your thighs parallel to the floor. Avoid rounding your shoulders, and avoid tilting your head forward.  When working at a desk or a computer, keep your desk at a height where your hands are slightly lower than your elbows. Slide your chair under your desk so you are close enough to maintain good posture.  When working at a computer, place your monitor at a height where you are looking straight ahead and you do not have to tilt your head forward or downward to look at the screen. Resting   When lying down and resting, avoid positions that are most painful for you.  If you have pain with activities such as sitting, bending, stooping, or squatting (flexion-based activities), lie in a position in which  your body does not bend very much. For example, avoid curling up on your side with your arms and knees near your chest (fetal position).  If you have pain with activities such as standing for a long time or reaching with your arms (extension-based activities), lie with your spine in a neutral position and bend your knees slightly. Try the following positions: ? Lying on your side with a pillow between your knees. ? Lying on your back with a pillow under your knees. Lifting   When lifting objects, keep your feet at least shoulder-width apart and tighten your abdominal muscles.  Bend your knees and hips and keep your spine neutral. It is important to lift using the strength of your legs, not your back. Do not lock your knees straight out.  Always ask for help to lift heavy or awkward objects. This information is not intended to replace advice given to you by your health care provider. Make sure you discuss any questions you have with your health care provider. Document Released: 09/18/2005 Document Revised: 05/25/2016 Document Reviewed:  06/30/2015 Elsevier Interactive Patient Education  [employer].

## 2017-12-20 NOTE — Progress Notes (Signed)
Subjective:  By signing my name below, I, [name], attest that this documentation has been prepared under the direction and in the presence of Delman Cheadle, MD Electronically Signed: Ladene Artist, ED Scribe 12/20/2017 at 11:39 AM.   Patient ID: Becky Cruz, female    DOB: [birth year], 47 y.o.   MRN: [id number]  Chief Complaint  Patient presents with  . Flank Pain    pt states she still having severe back pain. Pt reports no urinary symptoms  . Follow-up  . Imaging    Pt states to go over ultra sound report.   HPI Becky Cruz is a 47 y.o. female who presents to Primary Care at [facility] for f/u. Seen initially 1 month prior. Had been having over 4 months of intermittent lower abdominal pain, improved after bladder/bowel movements, associated with L flank pain. She failed antibiotics for cystitis. Also has h/o early menopause. Did empiric course of flagyl. Had a normal urine, neg culture, normal pap smear with neg GC/chlamydia and HPV, normal wet prep, CBC and CMP. Normal pelvic US.  Pt states that she is doing well overall. Reports abdominal pain resolved after completing antibiotics, but she is still having intermittent mid back pain that occasionally radiates into R thigh. Pain is worse at night after working all day. She has tried massages and heat with some relief.   Dizziness Pt states that dizziness is rare and she does not recall the last time it has happened. Denies palpitations.  Environmental Allergies Pt has been taking Singulair prn for allergies. She reports enlarged lymph nodes, nasal congestion, rhinorrhea and itching to the area in front of her L ear. Itching resolves with Singulair. Pt has not tried nasal spray.  Lab Work Pt is fasting at this visit. Reports slightly elevated cholesterol 1.5 yr ago which has not been rechecked since. Also reports a h/o low Vit D and would like to have this rechecked today.  Immunization History  Administered Date(s) Administered  .  Tdap 12/01/2010  Flu: pt declines  Past Medical History:  Diagnosis Date  . Anemia   . Dysrhythmia   . Pregnancy induced hypertension    Current Outpatient Medications on File Prior to Visit  Medication Sig Dispense Refill  . albuterol (PROVENTIL HFA;VENTOLIN HFA) 108 (90 Base) MCG/ACT inhaler Inhale 2 puffs into the lungs every 6 (six) hours as needed for wheezing or shortness of breath.    . montelukast (SINGULAIR) 10 MG tablet   4  . metroNIDAZOLE (FLAGYL) 500 MG tablet Take 1 tablet (500 mg total) by mouth 2 (two) times daily. (Patient not taking: Reported on 12/20/2017) 14 tablet 0  . Multiple Vitamins-Minerals (MULTIVITAMIN WITH MINERALS) tablet Gelatin free (Patient not taking: Reported on 12/20/2017) 120 tablet 2   No current facility-administered medications on file prior to visit.    No Known Allergies   Review of Systems  HENT: Positive for congestion and rhinorrhea.   Cardiovascular: Negative for palpitations.  Gastrointestinal: Negative for abdominal pain.  Genitourinary: Negative.   Musculoskeletal: Positive for back pain.  Allergic/Immunologic: Positive for environmental allergies.  Neurological: Positive for dizziness (rare; improved).  Hematological: Positive for adenopathy.      Objective:   Physical Exam  Constitutional: She is oriented to person, place, and time. She appears well-developed and well-nourished. No distress.  HENT:  Head: Normocephalic and atraumatic.  Nose: Rhinorrhea (clear) present.  Mouth/Throat: Oropharynx is clear and moist and mucous membranes are normal.  Nares: pale, boggy, swollen.  Eyes:  Conjunctivae and EOM are normal.  Neck: Neck supple. No tracheal deviation present.  Cardiovascular: Normal rate.  Pulmonary/Chest: Effort normal. No respiratory distress.  Musculoskeletal: Normal range of motion.  No tenderness over low thoracic or lumbar spine. No point tenderness over SI joints or trochanteric bursa.   Lymphadenopathy:        Head (left side): Preauricular (mild enlargement) adenopathy present.  Neurological: She is alert and oriented to person, place, and time.  Reflex Scores:      Patellar reflexes are 2+ on the right side and 2+ on the left side.      Achilles reflexes are 2+ on the right side and 2+ on the left side. Skin: Skin is warm and dry.  Psychiatric: She has a normal mood and affect. Her behavior is normal.  Nursing note and vitals reviewed.  BP 116/78 (BP Location: Left Arm, Patient Position: Sitting, Cuff Size: Large)   Pulse 80   Temp 98.6 F (37 C) (Oral)   Resp 18   Ht 5\' 5"  (1.651 m)   Wt 171 lb 6.4 oz (77.7 kg)   LMP 11/25/2012 Comment: stopped breast feeding Nov2014  SpO2 99%   BMI 28.52 kg/m     Assessment & Plan:   1. Elevated blood sugar   2. Vitamin D deficiency   3. Chronic bilateral low back pain without sciatica     Orders Placed This Encounter  Procedures  . TSH  . Lipid panel    Order Specific Question:   Has the patient fasted?    Answer:   Yes  . VITAMIN D 25 Hydroxy (Vit-D Deficiency, Fractures)  . Hemoglobin A1c     I personally performed the services described in this documentation, which was scribed in my presence. The recorded information has been reviewed and considered, and addended by me as needed.   Delman Cheadle, M.D.  Primary Care at [facility] [address] [place], [place] [address] [phone] phone [phone] fax  02/11/18 5:50 PM

## 2017-12-21 LAB — LIPID PANEL
CHOLESTEROL TOTAL: 150 mg/dL (ref 100–199)
Chol/HDL Ratio: 4.2 ratio (ref 0.0–4.4)
HDL: 36 mg/dL — ABNORMAL LOW (ref >39)
LDL CALC: 89 mg/dL (ref 0–99)
TRIGLYCERIDES: 124 mg/dL (ref 0–149)
VLDL Cholesterol Cal: 25 mg/dL (ref 5–40)

## 2017-12-21 LAB — TSH: TSH: 0.736 u[IU]/mL (ref 0.450–4.500)

## 2017-12-21 LAB — VITAMIN D 25 HYDROXY (VIT D DEFICIENCY, FRACTURES): Vit D, 25-Hydroxy: 31.7 ng/mL (ref 30.0–100.0)

## 2018-02-10 ENCOUNTER — Encounter: Payer: Self-pay | Admitting: Family Medicine

## 2018-02-11 ENCOUNTER — Encounter: Payer: Self-pay | Admitting: Family Medicine

## 2018-02-11 ENCOUNTER — Ambulatory Visit: Payer: BLUE CROSS/BLUE SHIELD | Admitting: Family Medicine

## 2018-02-11 ENCOUNTER — Other Ambulatory Visit: Payer: Self-pay

## 2018-02-11 VITALS — BP 108/74 | HR 80 | Temp 98.4°F | Resp 18 | Ht 65.0 in | Wt 169.6 lb

## 2018-02-11 DIAGNOSIS — R7303 Prediabetes: Secondary | ICD-10-CM | POA: Diagnosis not present

## 2018-02-11 DIAGNOSIS — K219 Gastro-esophageal reflux disease without esophagitis: Secondary | ICD-10-CM

## 2018-02-11 MED ORDER — OMEPRAZOLE 40 MG PO CPDR: 40.0000 mg | DELAYED_RELEASE_CAPSULE | Freq: Every day | ORAL | 3 refills | Status: DC

## 2018-02-11 NOTE — Progress Notes (Signed)
I,Arielle J Pollard,acting as a scribe for Shawnee Knapp, MD.,have documented all relevant documentation on the behalf of Shawnee Knapp, MD,as directed by  Shawnee Knapp, MD while in the presence of Shawnee Knapp, MD. 02/11/18 5:45 PM  Subjective:    Patient ID: Becky Cruz, female    DOB: [birth year], 47 y.o.   MRN: [id number]  Chief Complaint  Patient presents with  . Gastroesophageal Reflux    x2 weeks, pt states it was mild until Ramandan. Pt states sometimes she has a wierd tastes in her mouth and sometimes it fills like something is coming up and goes back done. Pt states she was given rx for this a few years ago. Pt states it was something close to omeprazole.    HPI Becky Cruz is a 47 y.o. female who presents to Primary Care at [facility] complaining of acid reflux.   The patient notes a recurrence of acid reflux  due to fasting during Ramada. The patient was advised to make an appointment to discuss.  During the patient's last visit, 2 months prior, she notes occasional mid back pain radiating to the mid thigh.   -She notes some chest and throat pain that happens a few hours after she eats. She experienced this last year with severe pain, and weight loss of 15 lbs. She was prescribed an acid reflux medicine which helped.   -She has thick mucus in her throat in the morning. She feels pain in the throat and chest. She denies vomiting, heartburn.  and trouble swallowing food. She denies having constipation or black-tar stools. She used to eat mint gum every night, and she noticed that this triggers the symptoms. She is trying to avoid foods that cause her symptoms. She tries to drink ginger tea, which has helped.   -She notes that she is dealing with family stress due to a family member passing away.       Patient Active Problem List   Diagnosis Date Noted  . Prediabetes 02/11/2018  . Secondary amenorrhea 02/06/2014  . Follow-up exam 05/22/2013   Past Medical History:  Diagnosis Date  .  Anemia   . Dysrhythmia   . Pregnancy induced hypertension    Past Surgical History:  Procedure Laterality Date  . WISDOM TOOTH EXTRACTION     No Known Allergies Prior to Admission medications   Medication Sig Start Date End Date Taking? Authorizing Provider  albuterol (PROVENTIL HFA;VENTOLIN HFA) 108 (90 Base) MCG/ACT inhaler Inhale 2 puffs into the lungs every 6 (six) hours as needed for wheezing or shortness of breath.    [provider]  metroNIDAZOLE (FLAGYL) 500 MG tablet Take 1 tablet (500 mg total) by mouth 2 (two) times daily. Patient not taking: Reported on 12/20/2017 11/19/17   Shawnee Knapp, MD  montelukast (SINGULAIR) 10 MG tablet  11/13/17   [provider]  Multiple Vitamins-Minerals (MULTIVITAMIN WITH MINERALS) tablet Gelatin free Patient not taking: Reported on 12/20/2017 05/06/13   Reyne Dumas, MD   Family History  Problem Relation Age of Onset  . Diabetes Maternal Grandmother   . Anesthesia problems Neg Hx   . Hypotension Neg Hx   . Malignant hyperthermia Neg Hx   . Pseudochol deficiency Neg Hx     Social History   Socioeconomic History  . Marital status: Married    Spouse name: Not on file  . Number of children: Not on file  . Years of education: Not on file  .  Highest education level: Not on file  Occupational History  . Not on file  Social Needs  . Financial resource strain: Not on file  . Food insecurity:    Worry: Not on file    Inability: Not on file  . Transportation needs:    Medical: Not on file    Non-medical: Not on file  Tobacco Use  . Smoking status: Never Smoker  . Smokeless tobacco: Never Used  Substance and Sexual Activity  . Alcohol use: No  . Drug use: No  . Sexual activity: Yes    Birth control/protection: None, Condom  Lifestyle  . Physical activity:    Days per week: Not on file    Minutes per session: Not on file  . Stress: Not on file  Relationships  . Social connections:    Talks on phone: Not on file     Gets together: Not on file    Attends religious service: Not on file    Active member of club or organization: Not on file    Attends meetings of clubs or organizations: Not on file    Relationship status: Not on file  . Intimate partner violence:    Fear of current or ex partner: Not on file    Emotionally abused: Not on file    Physically abused: Not on file    Forced sexual activity: Not on file  Other Topics Concern  . Not on file  Social History Narrative   Originally from [place].   Depression screen [facility] 2/9 03/07/2018 02/11/2018 12/20/2017  Decreased Interest 0 0 0  Down, Depressed, Hopeless 0 0 0  PHQ - 2 Score 0 0 0     Review of Systems  Constitutional: Negative for fatigue and unexpected weight change.  HENT: Negative.   Cardiovascular: Positive for chest pain.  Gastrointestinal: Positive for abdominal pain. Negative for blood in stool, constipation and vomiting.  Genitourinary: Negative.   Neurological: Negative for dizziness, syncope, light-headedness and headaches.  All other systems reviewed and are negative.      Objective:   Physical Exam  Constitutional: She is oriented to person, place, and time. She appears well-developed and well-nourished.  HENT:  Head: Normocephalic and atraumatic.  Eyes: Pupils are equal, round, and reactive to light. Conjunctivae and EOM are normal.  Neck: Carotid bruit is not present.  Cardiovascular: Normal rate, regular rhythm, normal heart sounds and intact distal pulses.  Pulmonary/Chest: Effort normal and breath sounds normal.  Abdominal: Soft. She exhibits no pulsatile midline mass. There is no tenderness.  Neurological: She is alert and oriented to person, place, and time.  Skin: Skin is warm and dry.  Psychiatric: She has a normal mood and affect. Her behavior is normal.  Vitals reviewed.   Vitals:   02/11/18 1752  BP: 108/74  Pulse: 80  Resp: 18  Temp: 98.4 F (36.9 C)  TempSrc: Oral  SpO2: 98%  Weight: 169 lb  9.6 oz (76.9 kg)  Height: 5\' 5"  (1.651 m)       Assessment & Plan:   1. Gastroesophageal reflux disease, esophagitis presence not specified -because of fasting during ramadan - has done very well on ppi just x 1 mo on prior years so repeat.  2. Prediabetes - reviewed recommended lifestyle change Lab Results  Component Value Date   HGBA1C 6.0 (H) 12/20/2017       Meds ordered this encounter  Medications  . omeprazole (PRILOSEC) 40 MG capsule    Sig: Take 1  capsule (40 mg total) by mouth daily. Works best taken 30 minutes before a meal.    Dispense:  30 capsule    Refill:  3    I personally performed the services described in this documentation, which was scribed in my presence. The recorded information has been reviewed and considered, and addended by me as needed.   Delman Cheadle, M.D.  Primary Care at [facility] [address] [place], [place] [address] [phone] phone [phone] fax  03/15/18 10:16 PM

## 2018-02-11 NOTE — Patient Instructions (Addendum)
IF you received an x-ray today, you will receive an invoice from [facility]. Please contact [facility] at [phone] with questions or concerns regarding your invoice.   IF you received labwork today, you will receive an invoice from [facility]. Please contact [facility] at [phone] with questions or concerns regarding your invoice.   Our billing staff will not be able to assist you with questions regarding bills from these companies.  You will be contacted with the lab results as soon as they are available. The fastest way to get your results is to activate your My Chart account. Instructions are located on the last page of this paperwork. If you have not heard from [place] regarding the results in 2 weeks, please contact this office.     Prediabetes Prediabetes is the condition of having a blood sugar (blood glucose) level that is higher than it should be, but not high enough for you to be diagnosed with type 2 diabetes. Having prediabetes puts you at risk for developing type 2 diabetes (type 2 diabetes mellitus). Prediabetes may be called impaired glucose tolerance or impaired fasting glucose. Prediabetes usually does not cause symptoms. Your health care provider can diagnose this condition with blood tests. You may be tested for prediabetes if you are overweight and if you have at least one other risk factor for prediabetes. Risk factors for prediabetes include:  Having a family member with type 2 diabetes.  Being overweight or obese.  Being older than age 70.  Being of American-Indian, African-American, Hispanic/Latino, or Asian/Pacific Islander descent.  Having an inactive (sedentary) lifestyle.  Having a history of gestational diabetes or polycystic ovarian syndrome (PCOS).  Having low levels of good cholesterol (HDL-C) or high levels of blood fats (triglycerides).  Having high blood pressure.  What is blood glucose and how is blood glucose  measured?  Blood glucose refers to the amount of glucose in your bloodstream. Glucose comes from eating foods that contain sugars and starches (carbohydrates) that the body breaks down into glucose. Your blood glucose level may be measured in mg/dL (milligrams per deciliter) or mmol/L (millimoles per liter).Your blood glucose may be checked with one or more of the following blood tests:  A fasting blood glucose (FBG) test. You will not be allowed to eat (you will fast) for at least 8 hours before a blood sample is taken. ? A normal range for FBG is 70-100 mg/dl (3.9-5.6 mmol/L).  An A1c (hemoglobin A1c) blood test. This test provides information about blood glucose control over the previous 2?83months.  An oral glucose tolerance test (OGTT). This test measures your blood glucose twice: ? After fasting. This is your baseline level. ? Two hours after you drink a beverage that contains glucose.  You may be diagnosed with prediabetes:  If your FBG is 100?125 mg/dL (5.6-6.9 mmol/L).  If your A1c level is 5.7?6.4%.  If your OGGT result is 140?199 mg/dL (7.8-11 mmol/L).  These blood tests may be repeated to confirm your diagnosis. What happens if blood glucose is too high? The pancreas produces a hormone (insulin) that helps move glucose from the bloodstream into cells. When cells in the body do not respond properly to insulin that the body makes (insulin resistance), excess glucose builds up in the blood instead of going into cells. As a result, high blood glucose (hyperglycemia) can develop, which can cause many complications. This is a symptom of prediabetes. What can happen if blood glucose stays higher than normal for a long time?  Having high blood glucose for a long time is dangerous. Too much glucose in your blood can damage your nerves and blood vessels. Long-term damage can lead to complications from diabetes, which may include:  Heart disease.  Stroke.  Blindness.  Kidney  disease.  Depression.  Poor circulation in the feet and legs, which could lead to surgical removal (amputation) in severe cases.  How can prediabetes be prevented from turning into type 2 diabetes?  To help prevent type 2 diabetes, take the following actions:  Be physically active. ? Do moderate-intensity physical activity for at least 30 minutes on at least 5 days of the week, or as much as told by your health care provider. This could be brisk walking, biking, or water aerobics. ? Ask your health care provider what activities are safe for you. A mix of physical activities may be best, such as walking, swimming, cycling, and strength training.  Lose weight as told by your health care provider. ? Losing 5-7% of your body weight can reverse insulin resistance. ? Your health care provider can determine how much weight loss is best for you and can help you lose weight safely.  Follow a healthy meal plan. This includes eating lean proteins, complex carbohydrates, fresh fruits and vegetables, low-fat dairy products, and healthy fats. ? Follow instructions from your health care provider about eating or drinking restrictions. ? Make an appointment to see a diet and nutrition specialist (registered dietitian) to help you create a healthy eating plan that is right for you.  Do not smoke or use any tobacco products, such as cigarettes, chewing tobacco, and e-cigarettes. If you need help quitting, ask your health care provider.  Take over-the-counter and prescription medicines as told by your health care provider. You may be prescribed medicines that help lower the risk of type 2 diabetes.  This information is not intended to replace advice given to you by your health care provider. Make sure you discuss any questions you have with your health care provider. Document Released: 01/10/2016 Document Revised: 02/24/2016 Document Reviewed: 11/09/2015 Elsevier Interactive Patient Education  2018 [employer].  Prediabetes Eating Plan Prediabetes-also called impaired glucose tolerance or impaired fasting glucose-is a condition that causes blood sugar (blood glucose) levels to be higher than normal. Following a healthy diet can help to keep prediabetes under control. It can also help to lower the risk of type 2 diabetes and heart disease, which are increased in people who have prediabetes. Along with regular exercise, a healthy diet:  Promotes weight loss.  Helps to control blood sugar levels.  Helps to improve the way that the body uses insulin.  What do I need to know about this eating plan?  Use the glycemic index (GI) to plan your meals. The index tells you how quickly a food will raise your blood sugar. Choose low-GI foods. These foods take a longer time to raise blood sugar.  Pay close attention to the amount of carbohydrates in the food that you eat. Carbohydrates increase blood sugar levels.  Keep track of how many calories you take in. Eating the right amount of calories will help you to achieve a healthy weight. Losing about 7 percent of your starting weight can help to prevent type 2 diabetes.  You may want to follow a Mediterranean diet. This diet includes a lot of vegetables, lean meats or fish, whole grains, fruits, and healthy oils and fats. What foods can I eat? Grains Whole grains, such as whole-wheat or whole-grain  breads, crackers, cereals, and pasta. Unsweetened oatmeal. Bulgur. Barley. Quinoa. Brown rice. Corn or whole-wheat flour tortillas or taco shells. Vegetables Lettuce. Spinach. Peas. Beets. Cauliflower. Cabbage. Broccoli. Carrots. Tomatoes. Squash. Eggplant. Herbs. Peppers. Onions. Cucumbers. Brussels sprouts. Fruits Berries. Bananas. Apples. Oranges. Grapes. Papaya. Mango. Pomegranate. Kiwi. Grapefruit. Cherries. Meats and Other Protein Sources Seafood. Lean meats, such as chicken and [place] or lean cuts of pork and beef. Tofu. Eggs. Nuts. Beans. Dairy Low-fat  or fat-free dairy products, such as yogurt, cottage cheese, and cheese. Beverages Water. Tea. Coffee. Sugar-free or diet soda. Seltzer water. Milk. Milk alternatives, such as soy or almond milk. Condiments Mustard. Relish. Low-fat, low-sugar ketchup. Low-fat, low-sugar barbecue sauce. Low-fat or fat-free mayonnaise. Sweets and Desserts Sugar-free or low-fat pudding. Sugar-free or low-fat ice cream and other frozen treats. Fats and Oils Avocado. Walnuts. Olive oil. The items listed above may not be a complete list of recommended foods or beverages. Contact your dietitian for more options. What foods are not recommended? Grains Refined white flour and flour products, such as bread, pasta, snack foods, and cereals. Beverages Sweetened drinks, such as sweet iced tea and soda. Sweets and Desserts Baked goods, such as cake, cupcakes, pastries, cookies, and cheesecake. The items listed above may not be a complete list of foods and beverages to avoid. Contact your dietitian for more information. This information is not intended to replace advice given to you by your health care provider. Make sure you discuss any questions you have with your health care provider. Document Released: 02/02/2015 Document Revised: 02/24/2016 Document Reviewed: 10/14/2014 Elsevier Interactive Patient Education  2017 [employer].

## 2018-03-07 ENCOUNTER — Other Ambulatory Visit: Payer: Self-pay

## 2018-03-07 ENCOUNTER — Encounter: Payer: Self-pay | Admitting: Family Medicine

## 2018-03-07 ENCOUNTER — Ambulatory Visit: Payer: BLUE CROSS/BLUE SHIELD | Admitting: Family Medicine

## 2018-03-07 VITALS — BP 102/72 | HR 93 | Temp 98.5°F | Resp 18 | Ht 64.17 in | Wt 165.0 lb

## 2018-03-07 DIAGNOSIS — E041 Nontoxic single thyroid nodule: Secondary | ICD-10-CM

## 2018-03-07 DIAGNOSIS — R5383 Other fatigue: Secondary | ICD-10-CM | POA: Diagnosis not present

## 2018-03-07 NOTE — Progress Notes (Signed)
Subjective:   03/07/2018 , 12:19 PM .  Patient was seen in Room 1 .   Patient ID: Becky Cruz, female    DOB: 29-Jan-1971, 47 y.o.   MRN: [id number] Chief Complaint  Patient presents with  . Mass    pt states she has noticed a lump center of her throat x 1 week.   . Fatigue   HPI Becky Cruz is a 47 y.o. female who presents to Primary Care at [facility] complaining of a mass noticed towards the center of her throat, as well as fatigue.   Past Medical History:  Diagnosis Date  . Anemia   . Dysrhythmia   . Pregnancy induced hypertension    Past Surgical History:  Procedure Laterality Date  . WISDOM TOOTH EXTRACTION     Prior to Admission medications   Medication Sig Start Date End Date Taking? Authorizing Provider  albuterol (PROVENTIL HFA;VENTOLIN HFA) 108 (90 Base) MCG/ACT inhaler Inhale 2 puffs into the lungs every 6 (six) hours as needed for wheezing or shortness of breath.   Yes [provider]  montelukast (SINGULAIR) 10 MG tablet  11/13/17  Yes [provider]  omeprazole (PRILOSEC) 40 MG capsule Take 1 capsule (40 mg total) by mouth daily. Works best taken 30 minutes before a meal. 02/11/18  Yes Shawnee Knapp, MD   No Known Allergies Family History  Problem Relation Age of Onset  . Diabetes Maternal Grandmother   . Anesthesia problems Neg Hx   . Hypotension Neg Hx   . Malignant hyperthermia Neg Hx   . Pseudochol deficiency Neg Hx    Social History   Socioeconomic History  . Marital status: Married    Spouse name: Not on file  . Number of children: Not on file  . Years of education: Not on file  . Highest education level: Not on file  Occupational History  . Not on file  Social Needs  . Financial resource strain: Not on file  . Food insecurity:    Worry: Not on file    Inability: Not on file  . Transportation needs:    Medical: Not on file    Non-medical: Not on file  Tobacco Use  . Smoking status: Never Smoker  . Smokeless tobacco: Never  Used  Substance and Sexual Activity  . Alcohol use: No  . Drug use: No  . Sexual activity: Yes    Birth control/protection: None, Condom  Lifestyle  . Physical activity:    Days per week: Not on file    Minutes per session: Not on file  . Stress: Not on file  Relationships  . Social connections:    Talks on phone: Not on file    Gets together: Not on file    Attends religious service: Not on file    Active member of club or organization: Not on file    Attends meetings of clubs or organizations: Not on file    Relationship status: Not on file  Other Topics Concern  . Not on file  Social History Narrative   Originally from [place].   Depression screen [facility] 2/9 03/07/2018 02/11/2018 12/20/2017  Decreased Interest 0 0 0  Down, Depressed, Hopeless 0 0 0  PHQ - 2 Score 0 0 0    Review of Systems  Constitutional: Positive for fatigue.  HENT: Positive for trouble swallowing. Negative for congestion, mouth sores, sinus pressure, sinus pain, sore throat, tinnitus and voice change.   Skin: Negative for color  change.  Hematological: Positive for adenopathy.  Psychiatric/Behavioral: The patient is nervous/anxious.        Objective:   Physical Exam  Constitutional: She is oriented to person, place, and time. She appears well-developed and well-nourished. No distress.  HENT:  Head: Normocephalic and atraumatic.  Eyes: Pupils are equal, round, and reactive to light. EOM are normal.  Neck: Neck supple. Thyroid mass present.  Well defined mass felt beneath left thyroid lobe, several centimeters in diameter, fluctuant, non tender; not noted on exam prior  Cardiovascular: Normal rate.  Pulmonary/Chest: Effort normal. No respiratory distress.  Musculoskeletal: Normal range of motion.  Lymphadenopathy:    She has cervical adenopathy (minimal AC node).  Neurological: She is alert and oriented to person, place, and time.  Skin: Skin is warm and dry.  Psychiatric: She has a normal mood and  affect. Her behavior is normal.  Nursing note and vitals reviewed.   BP 102/72   Pulse 93   Temp 98.5 F (36.9 C) (Oral)   Resp 18   Ht 5' 4.17" (1.63 m)   Wt 165 lb (74.8 kg)   LMP 11/25/2012 Comment: stopped breast feeding Nov2014  SpO2 98%   BMI 28.17 kg/m      Assessment & Plan:   1. Left thyroid nodule   2. Fatigue, unspecified type     Orders Placed This Encounter  Procedures  . US THYROID    Epic ORDER WT 165/NO NEEDS INS BCBS SB W/PT    Standing Status:   Future    Number of Occurrences:   1    Standing Expiration Date:   05/08/2019    Order Specific Question:   Reason for Exam (SYMPTOM  OR DIAGNOSIS REQUIRED)    Answer:   new large left thyroid nodule sev cm with some dysphagia    Order Specific Question:   Preferred imaging location?    Answer:   [facility]  . TSH+T4F+T3Free  . CBC with Differential/Platelet  . Sedimentation Rate    Delman Cheadle, M.D.  Primary Care at [facility] [address] [place], [place] [address] [phone] phone [phone] fax  03/15/18 10:20 PM

## 2018-03-07 NOTE — Patient Instructions (Addendum)
You will get a call from [facility] to schedule the thyroid ultrasound. If you prefer, you may call them directly to schedule this. Then I can send the results to your on MyChart.    IF you received an x-ray today, you will receive an invoice from [facility]. Please contact [facility] at [phone] with questions or concerns regarding your invoice.   IF you received labwork today, you will receive an invoice from [facility]. Please contact [facility] at [phone] with questions or concerns regarding your invoice.   Our billing staff will not be able to assist you with questions regarding bills from these companies.  You will be contacted with the lab results as soon as they are available. The fastest way to get your results is to activate your My Chart account. Instructions are located on the last page of this paperwork. If you have not heard from [place] regarding the results in 2 weeks, please contact this office.      Thyroid Nodule A thyroid nodule is an isolatedgrowth of thyroid cells that forms a lump in your thyroid gland. The thyroid gland is a butterfly-shaped gland. It is found in the lower front of your neck. This gland sends chemical messengers (hormones) through your blood to all parts of your body. These hormones are important in regulating your body temperature and helping your body to use energy. Thyroid nodules are common. Most are not cancerous (are benign). You may have one nodule or several nodules. Different types of thyroid nodules include:  Nodules that grow and fill with fluid (thyroid cysts).  Nodules that produce too much thyroid hormone (hot nodules or hyperthyroid).  Nodules that produce no thyroid hormone (cold nodules or hypothyroid).  Nodules that form from cancer cells (thyroid cancers).  What are the causes? Usually, the cause of this condition is not known. What increases the risk? Factors that make this  condition more likely to develop include:  Increasing age. Thyroid nodules become more common in people who are older than 46 years of age.  Gender. ? Benign thyroid nodules are more common in women. ? Cancerous (malignant) thyroid nodules are more common in men.  A family history that includes: ? Thyroid nodules. ? Pheochromocytoma. ? Thyroid carcinoma. ? Hyperparathyroidism.  Certain kinds of thyroid diseases, such as Hashimoto thyroiditis.  Lack of iodine.  A history of head and neck radiation, such as from X-rays.  What are the signs or symptoms? It is common for this condition to cause no symptoms. If you have symptoms, they may include:  A lump in your lower neck.  Feeling a lump or tickle in your throat.  Pain in your neck, jaw, or ear.  Having trouble swallowing.  Hot nodules may cause symptoms that include:  Weight loss.  Warm, flushed skin.  Feeling hot.  Feeling nervous.  A racing heartbeat.  Cold nodules may cause symptoms that include:  Weight gain.  Dry skin.  Brittle hair. This may also occur with hair loss.  Feeling cold.  Fatigue.  Thyroid cancer nodules may cause symptoms that include:  Hard nodules that feel stuck to the thyroid gland.  Hoarseness.  Lumps in the glands near your thyroid (lymph nodes).  How is this diagnosed? A thyroid nodule may be felt by your health care provider during a physical exam. This condition may also be diagnosed based on your symptoms. You may also have tests, including:  An ultrasound. This may be done to confirm the diagnosis.  A biopsy. This involves taking a sample from the nodule and looking at it under a microscope to see if the nodule is benign.  Blood tests to make sure that your thyroid is working properly.  Imaging tests such as MRI or CT scan may be done if: ? Your nodule is large. ? Your nodule is blocking your airway. ? Cancer is suspected.  How is this treated? Treatment  depends on the cause and size of your nodule or nodules. If the nodule is benign, treatment may not be necessary. Your health care provider may monitor the nodule to see if it goes away without treatment. If the nodule continues to grow, is cancerous, or does not go away:  It may need to be drained with a needle.  It may need to be removed with surgery.  If you have surgery, part or all of your thyroid gland may need to be removed as well. Follow these instructions at home:  Pay attention to any changes in your nodule.  Take over-the-counter and prescription medicines only as told by your health care provider.  Keep all follow-up visits as told by your health care provider. This is important. Contact a health care provider if:  Your voice changes.  You have trouble swallowing.  You have pain in your neck, ear, or jaw that is getting worse.  Your nodule gets bigger.  Your nodule starts to make it harder for you to breathe. Get help right away if:  You have a sudden fever.  You feel very weak.  Your muscles look like they are shrinking (muscle wasting).  You have mood swings.  You feel very restless.  You feel confused.  You are seeing or hearing things that other people do not see or hear (having hallucinations).  You feel suddenly nauseous or throw up.  You suddenly have diarrhea.  You have chest pain.  There is a loss of consciousness. This information is not intended to replace advice given to you by your health care provider. Make sure you discuss any questions you have with your health care provider. Document Released: 08/11/2004 Document Revised: 05/21/2016 Document Reviewed: 12/30/2014 Elsevier Interactive Patient Education  2018 [employer].  Fatigue Fatigue is feeling tired all of the time, a lack of energy, or a lack of motivation. Occasional or mild fatigue is often a normal response to activity or life in general. However, long-lasting (chronic) or  extreme fatigue may indicate an underlying medical condition. Follow these instructions at home: Watch your fatigue for any changes. The following actions may help to lessen any discomfort you are feeling:  Talk to your health care provider about how much sleep you need each night. Try to get the required amount every night.  Take medicines only as directed by your health care provider.  Eat a healthy and nutritious diet. Ask your health care provider if you need help changing your diet.  Drink enough fluid to keep your urine clear or pale yellow.  Practice ways of relaxing, such as yoga, meditation, massage therapy, or acupuncture.  Exercise regularly.  Change situations that cause you stress. Try to keep your work and personal routine reasonable.  Do not abuse illegal drugs.  Limit alcohol intake to no more than 1 drink per day for nonpregnant women and 2 drinks per day for men. One drink equals 12 ounces of beer, 5 ounces of wine, or 1 ounces of hard liquor.  Take a multivitamin, if directed by your health care provider.  Contact a health care provider if:  Your fatigue does not get better.  You have a fever.  You have unintentional weight loss or gain.  You have headaches.  You have difficulty: ? Falling asleep. ? Sleeping throughout the night.  You feel angry, guilty, anxious, or sad.  You are unable to have a bowel movement (constipation).  You skin is dry.  Your legs or another part of your body is swollen. Get help right away if:  You feel confused.  Your vision is blurry.  You feel faint or pass out.  You have a severe headache.  You have severe abdominal, pelvic, or back pain.  You have chest pain, shortness of breath, or an irregular or fast heartbeat.  You are unable to urinate or you urinate less than normal.  You develop abnormal bleeding, such as bleeding from the rectum, vagina, nose, lungs, or nipples.  You vomit blood.  You have  thoughts about harming yourself or committing suicide.  You are worried that you might harm someone else. This information is not intended to replace advice given to you by your health care provider. Make sure you discuss any questions you have with your health care provider. Document Released: 07/16/2007 Document Revised: 02/24/2016 Document Reviewed: 01/20/2014 Elsevier Interactive Patient Education  [employer].

## 2018-03-08 LAB — CBC WITH DIFFERENTIAL/PLATELET
Basophils Absolute: 0 10*3/uL (ref 0.0–0.2)
Basos: 0 %
EOS (ABSOLUTE): 0.4 10*3/uL (ref 0.0–0.4)
EOS: 5 %
HEMATOCRIT: 41.8 % (ref 34.0–46.6)
Hemoglobin: 14.1 g/dL (ref 11.1–15.9)
Immature Grans (Abs): 0 10*3/uL (ref 0.0–0.1)
Immature Granulocytes: 0 %
Lymphocytes Absolute: 1.4 10*3/uL (ref 0.7–3.1)
Lymphs: 18 %
MCH: 29.5 pg (ref 26.6–33.0)
MCHC: 33.7 g/dL (ref 31.5–35.7)
MCV: 87 fL (ref 79–97)
Monocytes Absolute: 0.4 10*3/uL (ref 0.1–0.9)
Monocytes: 5 %
NEUTROS PCT: 72 %
Neutrophils Absolute: 5.6 10*3/uL (ref 1.4–7.0)
PLATELETS: 229 10*3/uL (ref 150–450)
RBC: 4.78 x10E6/uL (ref 3.77–5.28)
RDW: 14.7 % (ref 12.3–15.4)
WBC: 7.8 10*3/uL (ref 3.4–10.8)

## 2018-03-08 LAB — TSH+T4F+T3FREE
Free T4: 1.19 ng/dL (ref 0.82–1.77)
T3, Free: 3.4 pg/mL (ref 2.0–4.4)
TSH: 0.54 u[IU]/mL (ref 0.450–4.500)

## 2018-03-08 LAB — SEDIMENTATION RATE: Sed Rate: 35 mm/hr — ABNORMAL HIGH (ref 0–32)

## 2018-03-13 ENCOUNTER — Telehealth: Payer: Self-pay | Admitting: Family Medicine

## 2018-03-13 NOTE — Telephone Encounter (Signed)
Copied from [place] [id number]. Topic: Quick Communication - See Telephone Encounter >> Mar 13, 2018  3:32 PM Neva Seat wrote: Needing last Thurs. Thyroid lab results. Please call pt to let her know.

## 2018-03-14 ENCOUNTER — Telehealth: Payer: Self-pay

## 2018-03-14 NOTE — Telephone Encounter (Signed)
Sent message to Dr. Brigitte Pulse re: pt wants lab results

## 2018-03-14 NOTE — Telephone Encounter (Signed)
Copied from [place] [id number]. Topic: Quick Communication - See Telephone Encounter >> Mar 13, 2018  3:32 PM Neva Seat wrote: Needing last Thurs. Thyroid lab results. Please call pt to let her know. >> Mar 14, 2018  4:57 PM Ivar Drape wrote: Patient called again asking that someone give her a call to go over her last labs results.  Labs are available on MyChart. CRM created.

## 2018-03-14 NOTE — Telephone Encounter (Signed)
Patient called again asking that someone give her a call to go over her last labs results.

## 2018-03-14 NOTE — Telephone Encounter (Signed)
Pt is requesting a call back directly from Dr. Brigitte Pulse to discuss lab results. She got results on mychart but would like explanation.

## 2018-03-14 NOTE — Telephone Encounter (Signed)
Advised [facility] rep that Becky Cruz has up to 2 weeks to review lab work.

## 2018-03-15 ENCOUNTER — Encounter: Payer: Self-pay | Admitting: Family Medicine

## 2018-03-15 ENCOUNTER — Ambulatory Visit
Admission: RE | Admit: 2018-03-15 | Discharge: 2018-03-15 | Disposition: A | Discharge status: Home / Self Care | Payer: BLUE CROSS/BLUE SHIELD | Source: Ambulatory Visit | Attending: Family Medicine | Admitting: Family Medicine

## 2018-03-15 DIAGNOSIS — K219 Gastro-esophageal reflux disease without esophagitis: Secondary | ICD-10-CM | POA: Insufficient documentation

## 2018-03-15 DIAGNOSIS — E041 Nontoxic single thyroid nodule: Secondary | ICD-10-CM

## 2018-03-15 NOTE — Telephone Encounter (Signed)
Note sent to MyChart that all results are normal.  Please call pt to make sure she saw the MyChart - now we will wait for the [place].

## 2018-03-18 ENCOUNTER — Telehealth: Payer: Self-pay | Admitting: Family Medicine

## 2018-03-18 ENCOUNTER — Encounter: Payer: Self-pay | Admitting: Family Medicine

## 2018-03-18 DIAGNOSIS — E042 Nontoxic multinodular goiter: Secondary | ICD-10-CM

## 2018-03-18 DIAGNOSIS — E041 Nontoxic single thyroid nodule: Secondary | ICD-10-CM

## 2018-03-18 NOTE — Telephone Encounter (Signed)
Pt calling back to check status on receiving her thyroid US results.

## 2018-03-18 NOTE — Telephone Encounter (Signed)
Called pt and sent her info on mychart and [employer] to her email

## 2018-03-18 NOTE — Telephone Encounter (Signed)
Pt has seen Dr.Shaw's mychart message. Since then she has sent another MyChart message and that was forwarded by nurse to provider to review. Will close this message.  Viewed by Davina Poke on 03/15/2018 10:16 PM  Written by Shawnee Knapp, MD on 03/15/2018 10:16 PM  Ms. Tullius -   Your thyroid, blood counts, and blood inflammation levels are all perfectly normal. The minor elevation in the esr is not enough to be clinically significant and could just be caused by some physical stress.  Dr. Brigitte Pulse

## 2018-03-18 NOTE — Telephone Encounter (Signed)
Called pt and reassured her that likely multinodular goiter but will get FNA bx of Rt nodule - nothing concerning seen on Left though that is where she noticed the new nodule.

## 2018-03-18 NOTE — Telephone Encounter (Signed)
Copied from [place] [id number]. Topic: General - Other >> Mar 18, 2018  1:21 PM Yvette Rack wrote: Reason for CRM: pt calling for U/S results of thyroid

## 2018-03-18 NOTE — Addendum Note (Signed)
Addended by: Shawnee Knapp on: 03/18/2018 08:20 PM   Modules accepted: Orders

## 2018-04-03 ENCOUNTER — Ambulatory Visit
Admission: RE | Admit: 2018-04-03 | Discharge: 2018-04-03 | Disposition: A | Discharge status: Home / Self Care | Payer: BLUE CROSS/BLUE SHIELD | Source: Ambulatory Visit | Attending: Family Medicine | Admitting: Family Medicine

## 2018-04-03 ENCOUNTER — Other Ambulatory Visit (HOSPITAL_COMMUNITY)
Admission: RE | Admit: 2018-04-03 | Discharge: 2018-04-03 | Disposition: A | Discharge status: Home / Self Care | Payer: BLUE CROSS/BLUE SHIELD | Source: Ambulatory Visit | Attending: Radiology | Admitting: Radiology

## 2018-04-03 DIAGNOSIS — E041 Nontoxic single thyroid nodule: Secondary | ICD-10-CM | POA: Diagnosis present

## 2018-04-05 ENCOUNTER — Encounter: Payer: Self-pay | Admitting: Family Medicine

## 2018-04-05 DIAGNOSIS — E041 Nontoxic single thyroid nodule: Secondary | ICD-10-CM

## 2018-04-17 ENCOUNTER — Ambulatory Visit
Admission: RE | Admit: 2018-04-17 | Discharge: 2018-04-17 | Disposition: A | Discharge status: Home / Self Care | Payer: BLUE CROSS/BLUE SHIELD | Source: Ambulatory Visit | Attending: Family Medicine | Admitting: Family Medicine

## 2018-04-17 ENCOUNTER — Other Ambulatory Visit (HOSPITAL_COMMUNITY)
Admission: RE | Admit: 2018-04-17 | Discharge: 2018-04-17 | Disposition: A | Discharge status: Home / Self Care | Payer: BLUE CROSS/BLUE SHIELD | Source: Ambulatory Visit | Attending: Radiology | Admitting: Radiology

## 2018-04-17 DIAGNOSIS — E041 Nontoxic single thyroid nodule: Secondary | ICD-10-CM | POA: Diagnosis not present

## 2018-04-19 ENCOUNTER — Telehealth: Payer: Self-pay | Admitting: Family Medicine

## 2018-04-19 NOTE — Telephone Encounter (Signed)
Copied from [place] [id number]. Topic: Quick Communication - See Telephone Encounter >> Apr 19, 2018 11:29 AM Gardiner Ramus wrote: CRM for notification. See Telephone encounter for: 04/19/18. Pt called to check if labs were available to discuss. Please advise [phone]

## 2018-04-22 NOTE — Telephone Encounter (Signed)
Pt called to get lab results, contact pt when ready

## 2018-04-23 NOTE — Telephone Encounter (Signed)
Spoke with pt this morning and Informed her that I would forward this message to the provider and she would get back with her after reviewing her results, she verbalized understanding.

## 2018-04-23 NOTE — Telephone Encounter (Signed)
Sent to [employer]. Please confirm w/ pt that she received results - she was very anxious

## 2018-04-24 NOTE — Telephone Encounter (Signed)
Pt notified and verbalized understanding.

## 2019-12-05 ENCOUNTER — Other Ambulatory Visit: Payer: Self-pay

## 2019-12-05 ENCOUNTER — Ambulatory Visit (INDEPENDENT_AMBULATORY_CARE_PROVIDER_SITE_OTHER): Payer: 59 | Admitting: Adult Health Nurse Practitioner

## 2019-12-05 VITALS — BP 143/88 | HR 94 | Temp 97.8°F | Ht 63.0 in | Wt 168.0 lb

## 2019-12-05 DIAGNOSIS — E559 Vitamin D deficiency, unspecified: Secondary | ICD-10-CM | POA: Diagnosis not present

## 2019-12-05 DIAGNOSIS — R030 Elevated blood-pressure reading, without diagnosis of hypertension: Secondary | ICD-10-CM | POA: Diagnosis not present

## 2019-12-05 DIAGNOSIS — R7303 Prediabetes: Secondary | ICD-10-CM

## 2019-12-05 DIAGNOSIS — E042 Nontoxic multinodular goiter: Secondary | ICD-10-CM | POA: Diagnosis not present

## 2019-12-05 LAB — POCT URINALYSIS DIP (MANUAL ENTRY)
Bilirubin, UA: NEGATIVE
Glucose, UA: NEGATIVE mg/dL
Ketones, POC UA: NEGATIVE mg/dL
Leukocytes, UA: NEGATIVE
Nitrite, UA: NEGATIVE
Protein Ur, POC: NEGATIVE mg/dL
Spec Grav, UA: 1.025 (ref 1.010–1.025)
Urobilinogen, UA: 0.2 E.U./dL
pH, UA: 5.5 (ref 5.0–8.0)

## 2019-12-05 NOTE — Patient Instructions (Signed)
° ° ° °  If you have lab work done today you will be contacted with your lab results within the next 2 weeks.  If you have not heard from us then please contact us. The fastest way to get your results is to register for My Chart. ° ° °IF you received an x-ray today, you will receive an invoice from [facility]. Please contact [facility] at [phone] with questions or concerns regarding your invoice.  ° °IF you received labwork today, you will receive an invoice from [facility]. Please contact [facility] at [phone] with questions or concerns regarding your invoice.  ° °Our billing staff will not be able to assist you with questions regarding bills from these companies. ° °You will be contacted with the lab results as soon as they are available. The fastest way to get your results is to activate your My Chart account. Instructions are located on the last page of this paperwork. If you have not heard from us regarding the results in 2 weeks, please contact this office. °  ° ° ° °

## 2019-12-05 NOTE — Progress Notes (Signed)
Chief Complaint  Patient presents with  . Establish Care    HPI   Patient presents to reestablish care with a provider.  She previously saw Dr. Brigitte Pulse.  In the past few months, she has lost over 20 pounds.  She has cut out all sugar, sweets, and other caloric beverages.  She has been exercising almost every day.  She does this and cares for her 4 children.  She is feeling much better than in the past when she was seen at this clinic.  She would like all screening labs and to see if that her numbers have changed since her last visit here at [place].  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  Medications    Current Outpatient Medications:  .  albuterol (PROVENTIL HFA;VENTOLIN HFA) 108 (90 Base) MCG/ACT inhaler, Inhale 2 puffs into the lungs every 6 (six) hours as needed for wheezing or shortness of breath., Disp: , Rfl:  .  montelukast (SINGULAIR) 10 MG tablet, , Disp: , Rfl: 4 .  omeprazole (PRILOSEC) 40 MG capsule, Take 1 capsule (40 mg total) by mouth daily. Works best taken 30 minutes before a meal. (Patient not taking: Reported on 12/05/2019), Disp: 30 capsule, Rfl: 3   Review of Systems    Constitutional: Negative for activity change, appetite change, chills and fever.  HENT: Negative for congestion, nosebleeds, trouble swallowing and voice change.   Respiratory: Negative for cough, shortness of breath and wheezing.   Cardiac:  Negative for chest pain, pressure, syncope  Gastrointestinal: Negative for diarrhea, nausea and vomiting.  Genitourinary: Negative for difficulty urinating, dysuria, flank pain and hematuria.  Musculoskeletal:  Negative for back pain, joint swelling and neck pain.  Neurological: Negative for dizziness, speech difficulty, light-headedness and numbness.  See HPI. All other review of systems negative.     Physical Exam:    height is '5\' 3"'  (1.6 m) and weight is 168 lb (76.2 kg). Her temporal temperature is 97.8 F (36.6 C). Her blood pressure is 143/88 (abnormal) and her pulse is 94. Her oxygen saturation is 98%.   Physical Examination: General appearance - alert, well appearing, and in no distress and oriented to person, place, and time Mental status - normal mood, behavior, speech, dress, motor activity, and thought processes Eyes - PERRL. Extraocular movements intact.  No nystagmus.  Neck - supple, no significant adenopathy, carotids upstroke normal bilaterally, no bruits, thyroid exam: thyroid is normal in size without nodules or tenderness Chest - clear to auscultation, no wheezes, rales or rhonchi, symmetric air entry  Heart - normal rate, regular rhythm, normal S1, S2, no murmurs, rubs, clicks or gallops Extremities - dependent LE edema without clubbing or cyanosis Skin - normal coloration and turgor, no rashes, no suspicious skin lesions noted  No hyperpigmentation of skin.  No current hematomas noted   Lab /Imaging Review    orders written for new lab studies as appropriate; see orders.   Assessment & Plan:  Becky Cruz is a 49 y.o. female   1. Elevated BP without diagnosis of hypertension   2. Prediabetes   3. Multiple thyroid nodules   4. Vitamin D deficiency    Orders Placed This Encounter  Procedures  . CMP14+EGFR  . Thyroid  Panel With TSH  . Hemoglobin A1c  . VITAMIN D 25 Hydroxy (Vit-D Deficiency, Fractures)  . POCT urinalysis dipstick     She will monitor her blood pressure and we will follow-up within a month to determine if it is under control.  She is in line with this plan. Glyn Ade, NP

## 2019-12-06 LAB — THYROID PANEL WITH TSH
Free Thyroxine Index: 2 (ref 1.2–4.9)
T3 Uptake Ratio: 25 % (ref 24–39)
T4, Total: 8 ug/dL (ref 4.5–12.0)
TSH: 0.65 u[IU]/mL (ref 0.450–4.500)

## 2019-12-06 LAB — HEMOGLOBIN A1C
Est. average glucose Bld gHb Est-mCnc: 111 mg/dL
Hgb A1c MFr Bld: 5.5 % (ref 4.8–5.6)

## 2019-12-06 LAB — CMP14+EGFR
ALT: 33 IU/L — ABNORMAL HIGH (ref 0–32)
AST: 25 IU/L (ref 0–40)
Albumin/Globulin Ratio: 1.6 (ref 1.2–2.2)
Albumin: 4.4 g/dL (ref 3.8–4.8)
Alkaline Phosphatase: 95 IU/L (ref 39–117)
BUN/Creatinine Ratio: 20 (ref 9–23)
BUN: 16 mg/dL (ref 6–24)
Bilirubin Total: 0.2 mg/dL (ref 0.0–1.2)
CO2: 22 mmol/L (ref 20–29)
Calcium: 9.4 mg/dL (ref 8.7–10.2)
Chloride: 101 mmol/L (ref 96–106)
Creatinine, Ser: 0.8 mg/dL (ref 0.57–1.00)
GFR calc Af Amer: 102 mL/min/{1.73_m2} (ref >59)
GFR calc non Af Amer: 88 mL/min/{1.73_m2} (ref >59)
Globulin, Total: 2.7 g/dL (ref 1.5–4.5)
Glucose: 93 mg/dL (ref 65–99)
Potassium: 4.1 mmol/L (ref 3.5–5.2)
Sodium: 140 mmol/L (ref 134–144)
Total Protein: 7.1 g/dL (ref 6.0–8.5)

## 2019-12-06 LAB — VITAMIN D 25 HYDROXY (VIT D DEFICIENCY, FRACTURES): Vit D, 25-Hydroxy: 40.9 ng/mL (ref 30.0–100.0)

## 2019-12-08 ENCOUNTER — Encounter: Payer: Self-pay | Admitting: Adult Health Nurse Practitioner

## 2019-12-08 DIAGNOSIS — R030 Elevated blood-pressure reading, without diagnosis of hypertension: Secondary | ICD-10-CM | POA: Insufficient documentation

## 2019-12-08 DIAGNOSIS — E042 Nontoxic multinodular goiter: Secondary | ICD-10-CM | POA: Insufficient documentation

## 2019-12-08 DIAGNOSIS — E559 Vitamin D deficiency, unspecified: Secondary | ICD-10-CM | POA: Insufficient documentation

## 2019-12-15 ENCOUNTER — Telehealth: Payer: Self-pay | Admitting: Adult Health Nurse Practitioner

## 2019-12-15 NOTE — Telephone Encounter (Signed)
Patient called requesting to speak to someone in regards to the labs from her new patient appointment   Please advise

## 2019-12-16 ENCOUNTER — Encounter: Payer: BLUE CROSS/BLUE SHIELD | Admitting: Adult Health Nurse Practitioner

## 2019-12-16 NOTE — Telephone Encounter (Signed)
Spoke with pt and she stated that her BP today was 121/87. I will document in chart

## 2019-12-16 NOTE — Telephone Encounter (Signed)
LVM for pt regarding her Lab results and if she has any questions/concerns to please give [place] a call at the office.

## 2019-12-16 NOTE — Telephone Encounter (Signed)
Pts BP today 3/16 2021 was 121/87.

## 2019-12-16 NOTE — Telephone Encounter (Signed)
Pt called back regarding the missed call about results is ready to call back. [phone] Please advise.

## 2020-01-28 ENCOUNTER — Ambulatory Visit (INDEPENDENT_AMBULATORY_CARE_PROVIDER_SITE_OTHER): Payer: 59 | Admitting: Adult Health Nurse Practitioner

## 2020-01-28 ENCOUNTER — Encounter: Payer: Self-pay | Admitting: Adult Health Nurse Practitioner

## 2020-01-28 ENCOUNTER — Other Ambulatory Visit: Payer: Self-pay

## 2020-01-28 VITALS — BP 137/87 | HR 90 | Temp 98.5°F | Ht 63.0 in | Wt 163.0 lb

## 2020-01-28 DIAGNOSIS — Z09 Encounter for follow-up examination after completed treatment for conditions other than malignant neoplasm: Secondary | ICD-10-CM | POA: Diagnosis not present

## 2020-01-28 DIAGNOSIS — R7303 Prediabetes: Secondary | ICD-10-CM | POA: Diagnosis not present

## 2020-01-28 DIAGNOSIS — L659 Nonscarring hair loss, unspecified: Secondary | ICD-10-CM

## 2020-01-28 NOTE — Patient Instructions (Signed)
° ° ° °  If you have lab work done today you will be contacted with your lab results within the next 2 weeks.  If you have not heard from us then please contact us. The fastest way to get your results is to register for My Chart. ° ° °IF you received an x-ray today, you will receive an invoice from [facility]. Please contact [facility] at [phone] with questions or concerns regarding your invoice.  ° °IF you received labwork today, you will receive an invoice from [facility]. Please contact [facility] at [phone] with questions or concerns regarding your invoice.  ° °Our billing staff will not be able to assist you with questions regarding bills from these companies. ° °You will be contacted with the lab results as soon as they are available. The fastest way to get your results is to activate your My Chart account. Instructions are located on the last page of this paperwork. If you have not heard from us regarding the results in 2 weeks, please contact this office. °  ° ° ° °

## 2020-01-28 NOTE — Progress Notes (Signed)
01/28/2020  Becky Cruz June 21, 1971 [id number]    History   Chief Complaint  Patient presents with  . Alopecia    Pt stated that she has been loosing her hair in certain areas of her head.    HPI   Patient is following up on labs and recent hx of hair loss.  Labs were normal, improved.  She has improved her A1C into the non-diabetic range.  Discussed how often she could have sweets/chocolate.  IF exercising, 2-3x a week.  Currently, fasting for Ramadan    Persistent hair loss, patchy that is worsening.  Notes a new spot at the frontal hairline which is a dime-size patch of allopecia.  Was not there 3 days ago.  She is very concerned.  Given normal labs, we discussed dermatology referral for further evaluation.    Past Medical History:  Diagnosis Date  . Anemia   . Dysrhythmia   . Pregnancy induced hypertension    Past Surgical History:  Procedure Laterality Date  . WISDOM TOOTH EXTRACTION     Family History  Problem Relation Age of Onset  . Diabetes Maternal Grandmother   . Anesthesia problems Neg Hx   . Hypotension Neg Hx   . Malignant hyperthermia Neg Hx   . Pseudochol deficiency Neg Hx    Social History   Tobacco Use  . Smoking status: Never Smoker  . Smokeless tobacco: Never Used  Substance Use Topics  . Alcohol use: No  . Drug use: No   OB History    Gravida  4   Para  4   Term  4   Preterm      AB      Living  4     SAB      TAB      Ectopic      Multiple      Live Births  4          Review of Systems   Review of Systems See HPI Constitution: No fevers or chills No malaise No diaphoresis Skin: No rash or itching, positive for scalp hair loss  Eyes: no blurry vision, no double vision GU: no dysuria or hematuria Neuro: no dizziness or headaches   Allergies   Patient has no known allergies.  Home Medications    Current Outpatient Medications:  .  albuterol (PROVENTIL HFA;VENTOLIN HFA) 108 (90 Base) MCG/ACT inhaler,  Inhale 2 puffs into the lungs every 6 (six) hours as needed for wheezing or shortness of breath., Disp: , Rfl:  .  montelukast (SINGULAIR) 10 MG tablet, , Disp: , Rfl: 4 .  omeprazole (PRILOSEC) 40 MG capsule, Take 1 capsule (40 mg total) by mouth daily. Works best taken 30 minutes before a meal. (Patient not taking: Reported on 12/05/2019), Disp: 30 capsule, Rfl: 3  Meds Ordered and Administered this Visit  No orders of the defined types were placed in this encounter.   BP 137/87 (BP Location: Left Arm, Patient Position: Sitting, Cuff Size: Normal)   Pulse 90   Temp 98.5 F (36.9 C) (Temporal)   Ht 5\' 3"  (1.6 m)   Wt 163 lb (73.9 kg)   LMP 11/25/2012 Comment: stopped breast feeding Nov2014  SpO2 97%   BMI 28.87 kg/m   Physical Exam General appearance: alert, well appearing, and in no distress, oriented to person, place, and time and anxious. Chest: clear to auscultation, no wheezes, rales or rhonchi, symmetric air entry.  CVS exam: normal rate, regular rhythm,  normal S1, S2, no murmurs, rubs, clicks or gallops. Skin exam - normal coloration and turgor, scalp with 3 distinct areas of patchy hairloss approximately dime size with normal tissue underlying.  Mental Status: normal mood, behavior, speech, dress, motor activity, and thought processes, anxious.   MDM   1. Alopecia   2. Follow-up exam   3. Prediabetes     Orders Placed This Encounter  Procedures  . Ambulatory referral to Dermatology    Will f/u after patient sees Dermatology for evaluation of allopecia.  She is inline with this plan.   Glyn Ade, NP

## 2020-03-08 ENCOUNTER — Telehealth: Payer: Self-pay | Admitting: Adult Health Nurse Practitioner

## 2020-03-08 NOTE — Telephone Encounter (Signed)
Can we send her derm referral somewhere new so that this pt can be seen sooner for her alopicia

## 2020-03-08 NOTE — Telephone Encounter (Signed)
Pt called and is needing a another dermatologist referral because the one she was referred to is a  month and a half out.Pt stated she needs this referral because her issues are getting worse. Pt would like a call regarding this. Please advise.

## 2020-03-10 ENCOUNTER — Other Ambulatory Visit: Payer: Self-pay

## 2020-03-10 ENCOUNTER — Ambulatory Visit (HOSPITAL_COMMUNITY)
Admission: EM | Admit: 2020-03-10 | Discharge: 2020-03-10 | Disposition: A | Discharge status: Home / Self Care | Payer: 59

## 2020-03-10 ENCOUNTER — Encounter (HOSPITAL_COMMUNITY): Payer: Self-pay

## 2020-03-10 DIAGNOSIS — R197 Diarrhea, unspecified: Secondary | ICD-10-CM

## 2020-03-10 NOTE — ED Triage Notes (Signed)
Pt c/o recurrent diarrhea for approx 10 days. Pt states she awakes with diarrhea, takes imodium which controls her symptoms during the day. Pt states she is able to tolerate small bites of bland foods such as crackers if taking the imodium, but if she eats a large meal, the diarrhea worsens. Pt states she was informed that she had a a gallstone. Pt denies abdom pain, but states she has pain in her mid/right back area.  Denies n/v, fever, chills, dysuria sx, URI sx.  Pt states last antibiotic therapy was approx 6 months ago. Appetite intact, and pt reports able to tolerate/retain po liquids.

## 2020-03-10 NOTE — Discharge Instructions (Addendum)
I believe this may be some sort of gallbladder issue.  I have sent in a referral to GI specialist I have also put 3 contacts in your paperwork for primary care follow-up Please call around to see who you can get in with the fastest. Keep staying hydrated and try to avoid certain irritating foods to include spicy, greasy, fried fatty foods. Gallbladder eating plan on your discharge.  Follow up as needed for continued or worsening symptoms

## 2020-03-10 NOTE — ED Provider Notes (Signed)
[facility]    CSN: [id number] Arrival date & time: 03/10/20  1204      History   Chief Complaint Chief Complaint  Patient presents with  . Diarrhea    HPI Becky Cruz is a 49 y.o. female.   Patient is a 49 year old female with past medical history of anemia, alopecia, thyroid nodules, GERD.  She presents today with approximately 10 days or more of diarrhea that is been intermittent.  The diarrhea is usually worsened by eating foods that are fatty, greasy and large meals.  Diarrhea is improved with going to more bland diet.  She has been taking Imodium that helps some.  Discontinued this due to the bloating side effect.  She has been told previously that she has gallstones.  She denies any specific abdominal pain but has some pain in the right mid upper back area.  No fever, nausea, vomiting.  No recent traveling or antibiotic use.  Drinking fluids.  ROS per HPI      Past Medical History:  Diagnosis Date  . Anemia   . Dysrhythmia   . Pregnancy induced hypertension     Patient Active Problem List   Diagnosis Date Noted  . Alopecia 01/28/2020  . Multiple thyroid nodules 12/08/2019  . Vitamin D deficiency 12/08/2019  . Elevated BP without diagnosis of hypertension 12/08/2019  . Gastroesophageal reflux disease 03/15/2018  . Prediabetes 02/11/2018  . Secondary amenorrhea 02/06/2014  . Follow-up exam 05/22/2013    Past Surgical History:  Procedure Laterality Date  . WISDOM TOOTH EXTRACTION      OB History    Gravida  4   Para  4   Term  4   Preterm      AB      Living  4     SAB      TAB      Ectopic      Multiple      Live Births  4            Home Medications    Prior to Admission medications   Medication Sig Start Date End Date Taking? Authorizing Provider  loperamide (IMODIUM) 2 MG capsule Take by mouth as needed for diarrhea or loose stools.   Yes [provider]  albuterol (PROVENTIL HFA;VENTOLIN HFA) 108 (90  Base) MCG/ACT inhaler Inhale 2 puffs into the lungs every 6 (six) hours as needed for wheezing or shortness of breath.    [provider]  omeprazole (PRILOSEC) 40 MG capsule Take 1 capsule (40 mg total) by mouth daily. Works best taken 30 minutes before a meal. Patient not taking: Reported on 12/05/2019 02/11/18   Shawnee Knapp, MD  montelukast (SINGULAIR) 10 MG tablet  11/13/17 03/10/20  [provider]    Family History Family History  Problem Relation Age of Onset  . Diabetes Maternal Grandmother   . Anesthesia problems Neg Hx   . Hypotension Neg Hx   . Malignant hyperthermia Neg Hx   . Pseudochol deficiency Neg Hx     Social History Social History   Tobacco Use  . Smoking status: Never Smoker  . Smokeless tobacco: Never Used  Substance Use Topics  . Alcohol use: No  . Drug use: No     Allergies   Patient has no known allergies.   Review of Systems Review of Systems   Physical Exam Triage Vital Signs ED Triage Vitals  Enc Vitals Group     BP 03/10/20 1302  135/88     Pulse Rate 03/10/20 1302 92     Resp 03/10/20 1302 18     Temp 03/10/20 1302 98.3 F (36.8 C)     Temp Source 03/10/20 1302 Oral     SpO2 03/10/20 1302 100 %     Weight --      Height --      Head Circumference --      Peak Flow --      Pain Score 03/10/20 1300 0     Pain Loc --      Pain Edu? --      Excl. in [place]? --    No data found.  Updated Vital Signs BP 135/88 (BP Location: Right Arm)   Pulse 92   Temp 98.3 F (36.8 C) (Oral)   Resp 18   LMP 11/25/2012 Comment: stopped breast feeding Nov2014  SpO2 100%   Visual Acuity Right Eye Distance:   Left Eye Distance:   Bilateral Distance:    Right Eye Near:   Left Eye Near:    Bilateral Near:     Physical Exam Vitals and nursing note reviewed.  Constitutional:      General: She is not in acute distress.    Appearance: Normal appearance. She is not ill-appearing, toxic-appearing or diaphoretic.  HENT:     Head:  Normocephalic.     Nose: Nose normal.  Eyes:     Conjunctiva/sclera: Conjunctivae normal.  Pulmonary:     Effort: Pulmonary effort is normal.  Musculoskeletal:        General: Normal range of motion.     Cervical back: Normal range of motion.  Skin:    General: Skin is warm and dry.     Findings: No rash.  Neurological:     Mental Status: She is alert.  Psychiatric:        Mood and Affect: Mood normal.      UC Treatments / Results  Labs (all labs ordered are listed, but only abnormal results are displayed) Labs Reviewed - No data to display  EKG   Radiology No results found.  Procedures Procedures (including critical care time)  Medications Ordered in UC Medications - No data to display  Initial Impression / Assessment and Plan / UC Course  I have reviewed the triage vital signs and the nursing notes.  Pertinent labs & imaging results that were available during my care of the patient were reviewed by me and considered in my medical decision making (see chart for details).     Diarrhea and GI upset This is most likely due to some sort of gallbladder issue.  Not convinced of acute cholecystitis at this time.  Patient not having any abdominal pain, nausea, vomiting.  Symptoms seem to be controlled by diet Recommended continue gallbladder diet and avoid fatty, greasy foods and large meals. Continues to be hydrated. Referral sent in for GI for follow-up Also given contact for primary care for follow-up  Final diagnoses:  Diarrhea, unspecified type     Discharge Instructions     I believe this may be some sort of gallbladder issue.  I have sent in a referral to GI specialist I have also put 3 contacts in your paperwork for primary care follow-up Please call around to see who you can get in with the fastest. Keep staying hydrated and try to avoid certain irritating foods to include spicy, greasy, fried fatty foods. Gallbladder eating plan on your discharge.    Follow  up as needed for continued or worsening symptoms     ED Prescriptions    None     PDMP not reviewed this encounter.   Loura Halt A, NP 03/10/20 1411

## 2020-03-17 ENCOUNTER — Encounter: Payer: Self-pay | Admitting: Internal Medicine

## 2020-03-17 ENCOUNTER — Ambulatory Visit (INDEPENDENT_AMBULATORY_CARE_PROVIDER_SITE_OTHER): Payer: 59 | Admitting: Internal Medicine

## 2020-03-17 ENCOUNTER — Other Ambulatory Visit: Payer: Self-pay

## 2020-03-17 DIAGNOSIS — R197 Diarrhea, unspecified: Secondary | ICD-10-CM | POA: Diagnosis not present

## 2020-03-17 NOTE — Assessment & Plan Note (Addendum)
Becky Cruz is a 49 year old female with a past medical history significant for anemia, thyroid nodules and GER who presented today for diarrhea.  The patient was seen in an urgent care on 6/9 for this initially.  There was no imaging or labs drawn at this time.  The patient states that she has been having diarrhea for the past 2 weeks.  She has watery, brown bowel movements 4-5 times a day she states that she tends to have the movements earlier in the morning and later in the evening.  Most days she does not have any bowel movements during the daytime.  She tried taking Imodium, which did slow down the bowel movements but gave her abdominal pain.  She stopped taking this and her diarrhea returned.  The patient has not had any fevers, skin changes, abdominal pain, nausea or vomiting associated with the diarrhea.  She does endorse thinning and loss of her hair. She has not had any recent medication changes or antibiotic exposures.  She lives at home with her husband and children, who are all in good health and has not had a diarrheal illness.  Her and her family members are all fully vaccinated against COVID-33.  The patient states that over the past 2 days, her diarrhea has improved now that she is starting to eat more spicy foods.  Of note, the patient had a right upper quadrant ultrasound performed 9 months ago in [place].  We do not have the results of these, but she was told that she had gallstones.  After her ED urgent care visit, she was referred to GI which she has an appointment on 7/8.  Differential diagnosis includes lactose intolerance, hypothyroidism, microscopic colitis, Crohn's disease, celiac disease. Patient has had TSH done recently which makes hypothyroidism unlikely.  Patient does not seem to have a malabsorption issue based off of her history and has no abdominal pain, so celiac and Crohn's disease seem less likely as well.  Plan: -After discussions with the patient, we decided to continue to  watch the diarrhea to see if it improves further, since she has been experiencing improvement over the past 2 days with no intervention. -Follow-up in 2 weeks -Consider tissue transglutaminase antibody, lactose breath test, stool sample if things do not improve -Patient has a GI referral from her urgent care visit scheduled for  7/8

## 2020-03-17 NOTE — Progress Notes (Signed)
   CC: Diarrhea  HPI:  [name] is a 49 y.o.  female with a past medical history as noted below who presents for diarrhea for the past 2 weeks. Please refer to the problem based charting for further details.  Past Medical History:  Diagnosis Date  . Anemia   . Dysrhythmia   . Pregnancy induced hypertension    Review of Systems:  Negative unless mentioned in the HPI.  Physical Exam: Vitals:   03/17/20 1523  BP: 124/83  Pulse: 82  Temp: 98.2 F (36.8 C)  TempSrc: Oral  SpO2: 100%  Weight: 162 lb 11.2 oz (73.8 kg)  Height: 5\' 3"  (1.6 m)   Physical Exam Vitals reviewed.  Constitutional:      General: She is not in acute distress.    Appearance: Normal appearance. She is normal weight. She is not ill-appearing, toxic-appearing or diaphoretic.  Cardiovascular:     Rate and Rhythm: Normal rate and regular rhythm.     Pulses: Normal pulses.     Heart sounds: Normal heart sounds. No murmur heard.  No friction rub. No gallop.   Pulmonary:     Effort: Pulmonary effort is normal. No respiratory distress.     Breath sounds: Normal breath sounds. No wheezing or rales.  Abdominal:     General: Abdomen is flat. Bowel sounds are normal. There is no distension.     Palpations: Abdomen is soft. There is no mass.     Tenderness: There is no abdominal tenderness. There is no right CVA tenderness, left CVA tenderness, guarding or rebound.     Hernia: No hernia is present.  Neurological:     General: No focal deficit present.     Mental Status: She is alert and oriented to person, place, and time.  Psychiatric:        Mood and Affect: Mood normal.    Assessment & Plan:   See Encounters Tab for problem based charting.  Patient discussed with Dr. Philipp Ovens

## 2020-03-17 NOTE — Patient Instructions (Signed)
Thank you for visiting [place] in clinic today.  Below is a summary of what we discussed:  1.  Diarrhea -We will continue to monitor your diarrhea symptoms over the next 2 weeks -In the meantime, write down the foods you eat in a diary and see if there are any triggers to your diarrhea.  We can review this at your next visit -If things worsen or do not get better, call the clinic at [phone] to let us know.  We can put in orders to run tests on your stool. -If you you start to have fevers, increased abdominal pain, or if your stools turn black, or you start to see bright red blood, please seek medical attention immediately at the ED.  2.  Follow-up -Please schedule follow-up appointment to see [place] again in 2 weeks  If you have any questions or concerns please feel free to call us at any time.

## 2020-03-18 NOTE — Progress Notes (Signed)
Internal Medicine Clinic Attending  Case discussed with Dr. Alexander at the time of the visit.  We reviewed the resident's history and exam and pertinent patient test results.  I agree with the assessment, diagnosis, and plan of care documented in the resident's note.  

## 2020-03-31 ENCOUNTER — Ambulatory Visit: Payer: 59

## 2020-04-08 ENCOUNTER — Telehealth: Payer: Self-pay | Admitting: Physician Assistant

## 2020-04-08 ENCOUNTER — Encounter: Payer: Self-pay | Admitting: Physician Assistant

## 2020-04-08 ENCOUNTER — Ambulatory Visit (INDEPENDENT_AMBULATORY_CARE_PROVIDER_SITE_OTHER): Payer: 59 | Admitting: Physician Assistant

## 2020-04-08 VITALS — BP 122/82 | HR 87 | Ht 63.0 in | Wt 164.0 lb

## 2020-04-08 DIAGNOSIS — R197 Diarrhea, unspecified: Secondary | ICD-10-CM | POA: Diagnosis not present

## 2020-04-08 DIAGNOSIS — K920 Hematemesis: Secondary | ICD-10-CM

## 2020-04-08 DIAGNOSIS — R1011 Right upper quadrant pain: Secondary | ICD-10-CM | POA: Diagnosis not present

## 2020-04-08 DIAGNOSIS — Z1211 Encounter for screening for malignant neoplasm of colon: Secondary | ICD-10-CM | POA: Diagnosis not present

## 2020-04-08 MED ORDER — SUTAB 1479-225-188 MG PO TABS: 1.0000 | ORAL_TABLET | Freq: Once | ORAL | 0 refills | Status: AC

## 2020-04-08 MED ORDER — OMEPRAZOLE 40 MG PO CPDR: 40.0000 mg | DELAYED_RELEASE_CAPSULE | Freq: Two times a day (BID) | ORAL | 3 refills | Status: DC

## 2020-04-08 NOTE — Progress Notes (Signed)
Discussed with PA Lemmon. She feels the recently bleeding is unlikely to be GI in origin. Will proceed with EGD to exclude GI etiologies at time of screening colonoscopy. Recommend repeating CBC with recurrent bleeding.  She should plan to follow-up with her PCP if GI evaluation is non-diagnostic.   Brazos Sandoval L. Tarri Glenn, MD, MPH

## 2020-04-08 NOTE — Telephone Encounter (Signed)
Spoke with patient informed her that [facility] wants her to restart Omeprazole 40 mg twice daily until her procedure next week to see if that helps with her coughing/vomiting. Also informed the patient I will call [facility] for them to run the coupon. Patient voiced understanding. Spoke with [facility] they will run the coupon code provided.

## 2020-04-08 NOTE — Progress Notes (Addendum)
Chief Complaint: Diarrhea, abdominal pain and hematemesis  HPI:    Becky Cruz is a 49 year old female with a past medical history as listed below including alopecia, thyroid nodules and GERD, who was referred to me by Loura Halt A, NP for a complaint of diarrhea, abdominal pain and hematemesis.      03/10/2020 patient seen in the urgent care for diarrhea which she described for 10 days, worse with eating fatty greasy large meals.  Imodium is helping some.  It was thought this is due to some sort of gallbladder issue.    03/17/2020 patient saw PCP in regards to diarrhea.  At that time described diarrhea for 2 weeks with watery brown bowel movements 4-5 times a day with normal movements earlier in the morning and later in the evening.  That time described right upper quadrant ultrasound performed 9 months ago in [place].  She was told she had gallstones.    Today, the patient presents to clinic and explains that she was having diarrhea for about a month, but this stopped about 2 weeks ago and she has had none further.  Today she tells me that occasionally she will have right upper quadrant pain which comes out of the blue and may not even happen once a month.  She is wondering if we need to check on her gallstones.    Also describes over the past 2 days she has been vomiting up/passing some blood in her sputum.  Tells me that her throat is also very sore.  Denies dysphagia, heartburn or reflux.    Has never had a screening colonoscopy.    Denies fever, chills, weight loss, blood in her stool or symptoms that awaken her from sleep.  Past Medical History:  Diagnosis Date  . Anemia   . Dysrhythmia   . Pregnancy induced hypertension     Past Surgical History:  Procedure Laterality Date  . WISDOM TOOTH EXTRACTION      Current Outpatient Medications  Medication Sig Dispense Refill  . albuterol (PROVENTIL HFA;VENTOLIN HFA) 108 (90 Base) MCG/ACT inhaler Inhale 2 puffs into the lungs every 6 (six) hours  as needed for wheezing or shortness of breath.    . loperamide (IMODIUM) 2 MG capsule Take by mouth as needed for diarrhea or loose stools.    Marland Kitchen omeprazole (PRILOSEC) 40 MG capsule Take 1 capsule (40 mg total) by mouth daily. Works best taken 30 minutes before a meal. (Patient not taking: Reported on 12/05/2019) 30 capsule 3   No current facility-administered medications for this visit.    Allergies as of 04/08/2020  . (No Known Allergies)    Family History  Problem Relation Age of Onset  . Diabetes Maternal Grandmother   . Anesthesia problems Neg Hx   . Hypotension Neg Hx   . Malignant hyperthermia Neg Hx   . Pseudochol deficiency Neg Hx     Social History   Socioeconomic History  . Marital status: Married    Spouse name: Not on file  . Number of children: Not on file  . Years of education: Not on file  . Highest education level: Not on file  Occupational History  . Not on file  Tobacco Use  . Smoking status: Never Smoker  . Smokeless tobacco: Never Used  Substance and Sexual Activity  . Alcohol use: No  . Drug use: No  . Sexual activity: Yes    Birth control/protection: None, Condom  Other Topics Concern  . Not on file  Social History Narrative   Originally from [place].   Social Determinants of Health   Financial Resource Strain:   . Difficulty of Paying Living Expenses:   Food Insecurity:   . Worried About Charity fundraiser in the Last Year:   . Arboriculturist in the Last Year:   Transportation Needs:   . Film/video editor (Medical):   Marland Kitchen Lack of Transportation (Non-Medical):   Physical Activity:   . Days of Exercise per Week:   . Minutes of Exercise per Session:   Stress:   . Feeling of Stress :   Social Connections:   . Frequency of Communication with Friends and Family:   . Frequency of Social Gatherings with Friends and Family:   . Attends Religious Services:   . Active Member of Clubs or Organizations:   . Attends Archivist  Meetings:   Marland Kitchen Marital Status:   Intimate Partner Violence:   . Fear of Current or Ex-Partner:   . Emotionally Abused:   Marland Kitchen Physically Abused:   . Sexually Abused:     Review of Systems:    Constitutional: No weight loss, fever or chills Skin: No rash  Cardiovascular: No chest pain  Respiratory: No SOB  Gastrointestinal: See HPI and otherwise negative Genitourinary: No dysuria  Neurological: No headache Musculoskeletal: No new muscle or joint pain Hematologic: No bruising Psychiatric: No history of depression or anxiety   Physical Exam:  Vital signs: BP 122/82   Pulse 87   Ht 5\' 3"  (1.6 m)   Wt 164 lb (74.4 kg)   LMP 11/25/2012 Comment: stopped breast feeding Nov2014  BMI 29.05 kg/m   Constitutional:   Pleasant AA female appears to be in NAD, Well developed, Well nourished, alert and cooperative Head:  Normocephalic and atraumatic. Eyes:   PEERL, EOMI. No icterus. Conjunctiva pink. Ears:  Normal auditory acuity. Neck:  Supple Throat: Oral cavity and pharynx without inflammation, swelling or lesion.  Respiratory: Respirations even and unlabored. Lungs clear to auscultation bilaterally.   No wheezes, crackles, or rhonchi.  Cardiovascular: Normal S1, S2. No MRG. Regular rate and rhythm. No peripheral edema, cyanosis or pallor.  Gastrointestinal:  Soft, nondistended, nontender. No rebound or guarding. Normal bowel sounds. No appreciable masses or hepatomegaly. Rectal:  Not performed.  Msk:  Symmetrical without gross deformities. Without edema, no deformity or joint abnormality.  Neurologic:  Alert and  oriented x4;  grossly normal neurologically.  Skin:   Dry and intact without significant lesions or rashes. Psychiatric: Demonstrates good judgement and reason without abnormal affect or behaviors.  RELEVANT LABS AND IMAGING: CBC    Component Value Date/Time   WBC 7.8 03/07/2018 1441   WBC 4.7 11/19/2017 1215   WBC 6.3 02/06/2014 1218   RBC 4.78 03/07/2018 1441   RBC 4.65  11/19/2017 1215   RBC 4.55 02/06/2014 1218   HGB 14.1 03/07/2018 1441   HCT 41.8 03/07/2018 1441   PLT 229 03/07/2018 1441   MCV 87 03/07/2018 1441   MCH 29.5 03/07/2018 1441   MCH 28.8 11/19/2017 1215   MCH 28.6 02/06/2014 1218   MCHC 33.7 03/07/2018 1441   MCHC 33.1 11/19/2017 1215   MCHC 34.3 02/06/2014 1218   RDW 14.7 03/07/2018 1441   LYMPHSABS 1.4 03/07/2018 1441   MONOABS 0.5 05/06/2013 1050   EOSABS 0.4 03/07/2018 1441   BASOSABS 0.0 03/07/2018 1441    CMP     Component Value Date/Time   NA 140 12/05/2019 1617  K 4.1 12/05/2019 1617   CL 101 12/05/2019 1617   CO2 22 12/05/2019 1617   GLUCOSE 93 12/05/2019 1617   GLUCOSE 87 05/06/2013 1050   BUN 16 12/05/2019 1617   CREATININE 0.80 12/05/2019 1617   CREATININE 0.65 05/06/2013 1050   CALCIUM 9.4 12/05/2019 1617   PROT 7.1 12/05/2019 1617   ALBUMIN 4.4 12/05/2019 1617   AST 25 12/05/2019 1617   ALT 33 (H) 12/05/2019 1617   ALKPHOS 95 12/05/2019 1617   BILITOT 0.2 12/05/2019 1617   GFRNONAA 88 12/05/2019 1617   GFRAA 102 12/05/2019 1617    Assessment: 1.  Right upper quadrant pain: Intermittent, apparently ultrasound 9 months ago in [place] with gallstones, right now there is symptomatic 2.  Hematemesis: 2 days of coughing up/vomiting some blood, her history is unclear; consider esophageal versus gastric source versus other 3.  Diarrhea: Resolved now over the past 2 weeks; consider viral cause versus other 4.  Screening for colorectal cancer: Patient is 5 and never had screening for colorectal cancer.  Plan: 1.  Discussed patient's right upper quadrant pain with her.  Really there is no need to repeat an ultrasound at this time since we know that she has gallstones but they are really asymptomatic.  Explained that if she starts having problems with this then would recommend repeat ultrasound and possibly surgery. 2.  Patient describes some coughing up/vomiting blood over the past couple of days and a very sore  throat.  She is also due for screening colonoscopy.  We will go ahead and schedule her for an EGD and colonoscopy.  She requests a female physician and was scheduled with Dr. Tarri Glenn in the [facility].  Did discuss risks, benefits, limitations and alternatives.  She has had both of her Covid vaccines. 3. Will increase Omeprazole to 40 mg BID for now. 4.  Patient to follow in clinic per recommendations from Dr. Tarri Glenn after time of procedures.  Ellouise Newer, PA-C [facility] 04/08/2020, 9:23 AM  Cc: Orvan July, NP

## 2020-04-08 NOTE — Patient Instructions (Signed)
If you are age 48 or older, your body mass index should be between 23-30. Your Body mass index is 29.05 kg/m. If this is out of the aforementioned range listed, please consider follow up with your Primary Care Provider.  If you are age 72 or younger, your body mass index should be between 19-25. Your Body mass index is 29.05 kg/m. If this is out of the aformentioned range listed, please consider follow up with your Primary Care Provider.   You have been scheduled for an endoscopy and colonoscopy. Please follow the written instructions given to you at your visit today. Please pick up your prep supplies at the pharmacy within the next 1-3 days. If you use inhalers (even only as needed), please bring them with you on the day of your procedure.  Due to recent changes in healthcare laws, you may see the results of your imaging and laboratory studies on MyChart before your provider has had a chance to review them.  We understand that in some cases there may be results that are confusing or concerning to you. Not all laboratory results come back in the same time frame and the provider may be waiting for multiple results in order to interpret others.  Please give [place] 48 hours in order for your provider to thoroughly review all the results before contacting the office for clarification of your results.   Follow up in clinic per recommendations from Dr. Tarri Glenn after your procedure.

## 2020-04-09 ENCOUNTER — Encounter: Payer: Self-pay | Admitting: Gastroenterology

## 2020-04-15 ENCOUNTER — Encounter: Payer: Self-pay | Admitting: Physician Assistant

## 2020-04-15 ENCOUNTER — Ambulatory Visit (INDEPENDENT_AMBULATORY_CARE_PROVIDER_SITE_OTHER): Payer: 59 | Admitting: Physician Assistant

## 2020-04-15 ENCOUNTER — Other Ambulatory Visit: Payer: Self-pay

## 2020-04-15 DIAGNOSIS — L639 Alopecia areata, unspecified: Secondary | ICD-10-CM | POA: Diagnosis not present

## 2020-04-15 MED ORDER — CLOBETASOL PROPIONATE 0.05 % EX SOLN: 1.0000 "application " | Freq: Two times a day (BID) | CUTANEOUS | 2 refills | Status: DC

## 2020-04-15 NOTE — Progress Notes (Signed)
   New Patient Visit  Subjective  Becky Cruz is a 49 y.o. female who presents for the following: New Patient (Initial Visit) (Patient here today for alopecia x 3 months. Patient states that she has bald spots all over patient states that she hasn't noticed any change. Patient did change shampoo's but she's not sure that's what caused the bald spots. No family history of hair loss/alopecia.). She noticed it 3 months ago and it seemed to start in the back and then she also got a spot in the front. She states that her scalp does itch on occasion. No other hair loss of eyebrows or eyelashes. She tried black castor oil in the front and she feels she sees some new growth.   Objective  Well appearing patient in no apparent distress; mood and affect are within normal limits.  A focused examination was performed including scalp. Relevant physical exam findings are noted in the Assessment and Plan.  Objective  Mid Forehead, Mid Occipital Scalp, Mid Parietal Scalp (2): Nummular areas of hair loss x 4. All have some new central growth. Only the lesion on the left occipital scalp seems to have a loose edge with hair loss along the border.  Images      Assessment & Plan  Alopecia areata (4) Mid Parietal Scalp (2); Mid Occipital Scalp; Mid Forehead  Ordered Medications: clobetasol (TEMOVATE) 0.05 % external solution

## 2020-04-15 NOTE — Patient Instructions (Addendum)
Alopecia Areata, Adult  Alopecia areata is a condition that causes you to lose hair. You may lose hair on your scalp in patches. In some cases, you may lose all the hair on your scalp (alopecia totalis) or all the hair from your face and body (alopecia universalis). Alopecia areata is an autoimmune disease. This means that your body's defense system (immune system) mistakes normal parts of the body for germs or other things that can make you sick. When you have alopecia areata, the immune system attacks the hair follicles. Alopecia areata usually develops in childhood, but it can develop at any age. For some people, their hair grows back on its own and hair loss does not happen again. For others, their hair may fall out and grow back in cycles. The hair loss may last many years. Having this condition can be emotionally difficult, but it is not dangerous. What are the causes? The cause of this condition is not known. What increases the risk? This condition is more likely to develop in people who have:  A family history of alopecia.  A family history of another autoimmune disease, including type 1 diabetes and rheumatoid arthritis.  Asthma and allergies.  Down syndrome. What are the signs or symptoms? Round spots of patchy hair loss on the scalp is the main symptom of this condition. The spots may be mildly itchy. Other symptoms include:  Short dark hairs in the bald patches that are wider at the top (exclamation point hairs).  Dents, white spots, or lines in the fingernails or toenails.  Balding and body hair loss. This is rare. How is this diagnosed? This condition is diagnosed based on your symptoms and family history. Your health care provider will also check your scalp skin, teeth, and nails. Your health care provider may refer you to a specialist in hair and skin disorders (dermatologist). You may also have tests, including:  A hair pull test.  Blood tests or other screening tests  to check for autoimmune diseases, such as thyroid disease or diabetes.  Skin biopsy to confirm the diagnosis.  A procedure to examine the skin with a lighted magnifying instrument (dermoscopy). How is this treated? There is no cure for alopecia areata. Treatment is aimed at promoting the regrowth of hair and preventing the immune system from overreacting. No single treatment is right for all people with alopecia areata. It depends on the type of hair loss you have and how severe it is. Work with your health care provider to find the best treatment for you. Treatment may include:  Having regular checkups to make sure the condition is not getting worse (watchful waiting).  Steroid creams or pills for 6-8 weeks to stop the immune reaction and help hair to regrow more quickly.  Other topical medicines to alter the immune system response and support the hair growth cycle.  Steroid injections.  Therapy and counseling with a support group or therapist if you are having trouble coping with hair loss. Follow these instructions at home:  Learn as much as you can about your condition.  Apply topical creams only as told by your health care provider.  Take over-the-counter and prescription medicines only as told by your health care provider.  Consider getting a wig or products to make hair look fuller or to cover bald spots, if you feel uncomfortable with your appearance.  Get therapy or counseling if you are having a hard time coping with hair loss. Ask your health care provider to recommend  a counselor or support group.  Keep all follow-up visits as told by your health care provider. This is important. Contact a health care provider if:  Your hair loss gets worse, even with treatment.  You have new symptoms.  You are struggling emotionally. Summary  Alopecia areata is an autoimmune condition that makes your body's defense system (immune system) attack the hair follicles. This causes you  to lose hair.  Treatments may include regular checkups to make sure that the condition is not getting worse (watchful waiting), medicines, and steroid injections. This information is not intended to replace advice given to you by your health care provider. Make sure you discuss any questions you have with your health care provider. Document Revised: 08/31/2017 Document Reviewed: 10/06/2016 Elsevier Patient Education  2020 Elsevier Inc.  

## 2020-04-16 ENCOUNTER — Ambulatory Visit (AMBULATORY_SURGERY_CENTER): Payer: 59 | Admitting: Gastroenterology

## 2020-04-16 ENCOUNTER — Encounter: Payer: Self-pay | Admitting: Physician Assistant

## 2020-04-16 ENCOUNTER — Encounter: Payer: Self-pay | Admitting: Gastroenterology

## 2020-04-16 VITALS — BP 115/76 | HR 73 | Temp 96.0°F | Resp 12 | Ht 64.0 in | Wt 164.0 lb

## 2020-04-16 DIAGNOSIS — B9681 Helicobacter pylori [H. pylori] as the cause of diseases classified elsewhere: Secondary | ICD-10-CM | POA: Diagnosis not present

## 2020-04-16 DIAGNOSIS — K3189 Other diseases of stomach and duodenum: Secondary | ICD-10-CM

## 2020-04-16 DIAGNOSIS — K920 Hematemesis: Secondary | ICD-10-CM

## 2020-04-16 DIAGNOSIS — K2970 Gastritis, unspecified, without bleeding: Secondary | ICD-10-CM

## 2020-04-16 DIAGNOSIS — Z1211 Encounter for screening for malignant neoplasm of colon: Secondary | ICD-10-CM

## 2020-04-16 DIAGNOSIS — K649 Unspecified hemorrhoids: Secondary | ICD-10-CM

## 2020-04-16 DIAGNOSIS — K2951 Unspecified chronic gastritis with bleeding: Secondary | ICD-10-CM

## 2020-04-16 MED ORDER — SODIUM CHLORIDE 0.9 % IV SOLN
500.0000 mL | Freq: Once | INTRAVENOUS | Status: DC
Start: 2020-04-16 — End: 2020-04-16

## 2020-04-16 NOTE — Progress Notes (Signed)
Called to room to assist during endoscopic procedure.  Patient ID and intended procedure confirmed with present staff. Received instructions for my participation in the procedure from the performing physician.  

## 2020-04-16 NOTE — Patient Instructions (Signed)
Handout provided on gastritis and hemorrhoids.   YOU HAD AN ENDOSCOPIC PROCEDURE TODAY AT [facility]:   Refer to the procedure report that was given to you for any specific questions about what was found during the examination.  If the procedure report does not answer your questions, please call your gastroenterologist to clarify.  If you requested that your care partner not be given the details of your procedure findings, then the procedure report has been included in a sealed envelope for you to review at your convenience later.  YOU SHOULD EXPECT: Some feelings of bloating in the abdomen. Passage of more gas than usual.  Walking can help get rid of the air that was put into your GI tract during the procedure and reduce the bloating. If you had a lower endoscopy (such as a colonoscopy or flexible sigmoidoscopy) you may notice spotting of blood in your stool or on the toilet paper. If you underwent a bowel prep for your procedure, you may not have a normal bowel movement for a few days.  Please Note:  You might notice some irritation and congestion in your nose or some drainage.  This is from the oxygen used during your procedure.  There is no need for concern and it should clear up in a day or so.  SYMPTOMS TO REPORT IMMEDIATELY:   Following lower endoscopy (colonoscopy or flexible sigmoidoscopy):  Excessive amounts of blood in the stool  Significant tenderness or worsening of abdominal pains  Swelling of the abdomen that is new, acute  Fever of 100F or higher   Following upper endoscopy (EGD)  Vomiting of blood or coffee ground material  New chest pain or pain under the shoulder blades  Painful or persistently difficult swallowing  New shortness of breath  Fever of 100F or higher  Black, tarry-looking stools  For urgent or emergent issues, a gastroenterologist can be reached at any hour by calling [phone]. Do not use MyChart messaging for urgent concerns.     DIET:  We do recommend a small meal at first, but then you may proceed to your regular diet.  Drink plenty of fluids but you should avoid alcoholic beverages for 24 hours.  ACTIVITY:  You should plan to take it easy for the rest of today and you should NOT DRIVE or use heavy machinery until tomorrow (because of the sedation medicines used during the test).    FOLLOW UP: Our staff will call the number listed on your records 48-72 hours following your procedure to check on you and address any questions or concerns that you may have regarding the information given to you following your procedure. If we do not reach you, we will leave a message.  We will attempt to reach you two times.  During this call, we will ask if you have developed any symptoms of COVID 19. If you develop any symptoms (ie: fever, flu-like symptoms, shortness of breath, cough etc.) before then, please call [phone].  If you test positive for Covid 19 in the 2 weeks post procedure, please call and report this information to [place].    If any biopsies were taken you will be contacted by phone or by letter within the next 1-3 weeks.  Please call us at [phone] if you have not heard about the biopsies in 3 weeks.    SIGNATURES/CONFIDENTIALITY: You and/or your care partner have signed paperwork which will be entered into your electronic medical record.  These signatures attest to  the fact that that the information above on your After Visit Summary has been reviewed and is understood.  Full responsibility of the confidentiality of this discharge information lies with you and/or your care-partner.

## 2020-04-16 NOTE — Telephone Encounter (Signed)
This encounter was created in error - please disregard.

## 2020-04-16 NOTE — Op Note (Signed)
[facility] Patient Name: Becky Cruz Procedure Date: 04/16/2020 1:03 PM MRN: [id number] Endoscopist: Thornton Park MD, MD Age: 49 Referring MD:  Date of Birth: 06/16/1971 Gender: Female Account #: [id number] Procedure:                Upper GI endoscopy Indications:              Hematemesis, Diarrhea Medicines:                Monitored Anesthesia Care Procedure:                Pre-Anesthesia Assessment:                           - Prior to the procedure, a History and Physical                            was performed, and patient medications and                            allergies were reviewed. The patient's tolerance of                            previous anesthesia was also reviewed. The risks                            and benefits of the procedure and the sedation                            options and risks were discussed with the patient.                            All questions were answered, and informed consent                            was obtained. Prior Anticoagulants: The patient has                            taken no previous anticoagulant or antiplatelet                            agents. ASA Grade Assessment: II - A patient with                            mild systemic disease. After reviewing the risks                            and benefits, the patient was deemed in                            satisfactory condition to undergo the procedure.                           After obtaining informed consent, the endoscope was  passed under direct vision. Throughout the                            procedure, the patient's blood pressure, pulse, and                            oxygen saturations were monitored continuously. The                            Endoscope was introduced through the mouth, and                            advanced to the third part of duodenum. The upper                            GI endoscopy was accomplished  without difficulty.                            The patient tolerated the procedure well. Scope In: Scope Out: Findings:                 The esophagus was normal.                           The mucosa in the gastric body and in the gastric                            antrum is erythematous with a pronounced vascular                            pattern. Biopsies were taken from the antrum, body,                            and fundus with a cold forceps for histology.                            Estimated blood loss was minimal.                           The examined duodenum was normal. Biopsies were                            taken with a cold forceps for histology. Estimated                            blood loss was minimal. Complications:            No immediate complications. Estimated blood loss:                            Minimal. Estimated Blood Loss:     Estimated blood loss was minimal. Impression:               - Normal esophagus.                           -  Mildly abnormal gastric mucosa. Biopsied.                           - Normal examined duodenum. Biopsied. Recommendation:           - Patient has a contact number available for                            emergencies. The signs and symptoms of potential                            delayed complications were discussed with the                            patient. Return to normal activities tomorrow.                            Written discharge instructions were provided to the                            patient.                           - Resume previous diet.                           - Continue present medications.                           - Await pathology results.                           - Proceed with colonoscopy as previously planned.                           - Follow-up in the office with Darrell Jewel to review                            these results and further evaluate symptoms. Thornton Park MD, MD 04/16/2020  2:00:35 PM This report has been signed electronically.

## 2020-04-16 NOTE — Progress Notes (Signed)
Front desk-AER  Vs- CW

## 2020-04-16 NOTE — Op Note (Signed)
[facility] Patient Name: Becky Cruz Procedure Date: 04/16/2020 1:02 PM MRN: [id number] Endoscopist: Thornton Park MD, MD Age: 49 Referring MD:  Date of Birth: 30-Aug-1971 Gender: Female Account #: [id number] Procedure:                Colonoscopy Indications:              Screening for colorectal malignant neoplasm                           Recent complaints of diarrhea, resolved prior to                            colonoscopy Medicines:                Monitored Anesthesia Care Procedure:                Pre-Anesthesia Assessment:                           - Prior to the procedure, a History and Physical                            was performed, and patient medications and                            allergies were reviewed. The patient's tolerance of                            previous anesthesia was also reviewed. The risks                            and benefits of the procedure and the sedation                            options and risks were discussed with the patient.                            All questions were answered, and informed consent                            was obtained. Prior Anticoagulants: The patient has                            taken no previous anticoagulant or antiplatelet                            agents. ASA Grade Assessment: II - A patient with                            mild systemic disease. After reviewing the risks                            and benefits, the patient was deemed in  satisfactory condition to undergo the procedure.                           After obtaining informed consent, the colonoscope                            was passed under direct vision. Throughout the                            procedure, the patient's blood pressure, pulse, and                            oxygen saturations were monitored continuously. The                            Colonoscope was introduced through the anus and                             advanced to the 3 cm into the ileum. A second                            forward view of the right colon was performed. The                            colonoscopy was performed with moderate difficulty                            due to significant looping and a tortuous colon.                            Successful completion of the procedure was aided by                            straightening and shortening the scope to obtain                            bowel loop reduction. The patient tolerated the                            procedure well. The quality of the bowel                            preparation was good. The terminal ileum, ileocecal                            valve, appendiceal orifice, and rectum were                            photographed. Scope In: 1:38:45 PM Scope Out: 1:53:54 PM Scope Withdrawal Time: 0 hours 10 minutes 55 seconds  Total Procedure Duration: 0 hours 15 minutes 9 seconds  Findings:                 The perianal and digital rectal examinations were  normal.                           The colon (entire examined portion) appeared                            normal. Biopsies for histology were taken with a                            cold forceps from the right colon and left colon                            for evaluation of microscopic colitis. Estimated                            blood loss was minimal.                           The exam was otherwise without abnormality on                            direct and retroflexion views except for small                            internal hemorrhoids. Complications:            No immediate complications. Estimated blood loss:                            Minimal. Estimated Blood Loss:     Estimated blood loss was minimal. Impression:               - The entire examined colon is normal. Biopsied.                           - The examination was otherwise normal on direct                             and retroflexion views. Recommendation:           - Patient has a contact number available for                            emergencies. The signs and symptoms of potential                            delayed complications were discussed with the                            patient. Return to normal activities tomorrow.                            Written discharge instructions were provided to the                            patient.                           -  Resume previous diet.                           - Continue present medications.                           - Await pathology results.                           - Repeat colonoscopy in 10 years for surveillance,                            earlier with any new symptoms.                           - Emerging evidence supports eating a diet of                            fruits, vegetables, grains, calcium, and yogurt                            while reducing red meat and alcohol may reduce the                            risk of colon cancer.                           - Thank you for allowing me to be involved in your                            colon cancer prevention. Thornton Park MD, MD 04/16/2020 2:03:47 PM This report has been signed electronically.

## 2020-04-16 NOTE — Progress Notes (Signed)
pt tolerated well. VSS. awake and to recovery. Report given to RN. Bite block inserted and removed without trauma. 

## 2020-04-20 ENCOUNTER — Telehealth: Payer: Self-pay | Admitting: *Deleted

## 2020-04-20 ENCOUNTER — Telehealth: Payer: Self-pay

## 2020-04-20 NOTE — Telephone Encounter (Signed)
  Follow up Call-  Call back number 04/16/2020  Post procedure Call Back phone  # [phone]  Permission to leave phone message Yes  Some recent data might be hidden     Patient questions:  Do you have a fever, pain , or abdominal swelling? No. Pain Score  0 *  Have you tolerated food without any problems? Yes.    Have you been able to return to your normal activities? Yes.    Do you have any questions about your discharge instructions: Diet   No. Medications  No. Follow up visit  No.  Do you have questions or concerns about your Care? No.  Actions: * If pain score is 4 or above: No action needed, pain <4.   1. Have you developed a fever since your procedure? no  2.   Have you had an respiratory symptoms (SOB or cough) since your procedure? no  3.   Have you tested positive for COVID 19 since your procedure no  4.   Have you had any family members/close contacts diagnosed with the COVID 19 since your procedure?  no   If yes to any of these questions please route to Joylene John, RN and Erenest Rasher, RN

## 2020-04-20 NOTE — Telephone Encounter (Signed)
Attempted f/u phone call. No answer. Left message. °

## 2020-04-22 ENCOUNTER — Other Ambulatory Visit: Payer: Self-pay

## 2020-04-22 DIAGNOSIS — A048 Other specified bacterial intestinal infections: Secondary | ICD-10-CM

## 2020-04-22 MED ORDER — BISMUTH SUBSALICYLATE 262 MG PO TABS: 524.0000 mg | ORAL_TABLET | Freq: Four times a day (QID) | ORAL | 0 refills | Status: AC

## 2020-04-22 MED ORDER — METRONIDAZOLE 500 MG PO TABS: 500.0000 mg | ORAL_TABLET | Freq: Two times a day (BID) | ORAL | 0 refills | Status: DC

## 2020-04-22 MED ORDER — TETRACYCLINE HCL 500 MG PO CAPS: 500.0000 mg | ORAL_CAPSULE | Freq: Four times a day (QID) | ORAL | 0 refills | Status: AC

## 2020-04-23 ENCOUNTER — Telehealth: Payer: Self-pay | Admitting: Physician Assistant

## 2020-04-23 ENCOUNTER — Other Ambulatory Visit: Payer: Self-pay

## 2020-04-23 DIAGNOSIS — A048 Other specified bacterial intestinal infections: Secondary | ICD-10-CM

## 2020-04-23 MED ORDER — METRONIDAZOLE 500 MG PO TABS: 500.0000 mg | ORAL_TABLET | Freq: Two times a day (BID) | ORAL | 0 refills | Status: AC

## 2020-04-23 NOTE — Telephone Encounter (Signed)
Pt is requesting a call back from [place] in regards to the [place] message she received

## 2020-04-23 NOTE — Telephone Encounter (Signed)
Spoke with patient, needed clarification on metronidazole. 10 day supply sent in by error. New RX for 14 days has been sent in, patient aware

## 2020-04-26 ENCOUNTER — Telehealth: Payer: Self-pay | Admitting: Physician Assistant

## 2020-04-26 NOTE — Telephone Encounter (Signed)
Patient calling because pharmacy told her that they needed to speak with our office about the script that was sent. They will not give her anymore info and I do not see that we have received anything from the pharmacy. She [facility] that [facility] is [facility] [address].

## 2020-04-26 NOTE — Telephone Encounter (Signed)
Is $18.99 cash pay with good rx at [employer] [facility]. Or $23 at [facility]

## 2020-04-28 ENCOUNTER — Telehealth: Payer: Self-pay | Admitting: Gastroenterology

## 2020-04-28 NOTE — Telephone Encounter (Signed)
The pt has been advised that the prescription was sent to the pharmacy on 7/23.  [facility] and see it [facility] is ready for pick up.

## 2020-04-28 NOTE — Telephone Encounter (Signed)
Pt is requesting a call back from a nurse in regards to filling her prescription for metronidazole.

## 2020-05-25 ENCOUNTER — Encounter: Payer: Self-pay | Admitting: Physician Assistant

## 2020-05-25 ENCOUNTER — Ambulatory Visit (INDEPENDENT_AMBULATORY_CARE_PROVIDER_SITE_OTHER): Payer: 59 | Admitting: Physician Assistant

## 2020-05-25 ENCOUNTER — Other Ambulatory Visit: Payer: 59

## 2020-05-25 VITALS — BP 122/90 | HR 68 | Ht 63.0 in | Wt 167.0 lb

## 2020-05-25 DIAGNOSIS — A048 Other specified bacterial intestinal infections: Secondary | ICD-10-CM

## 2020-05-25 NOTE — Addendum Note (Signed)
Addended by: Horris Latino on: 05/25/2020 09:23 AM   Modules accepted: Orders

## 2020-05-25 NOTE — Patient Instructions (Addendum)
If you are age 48 or older, your body mass index should be between 23-30. Your Body mass index is 29.58 kg/m. If this is out of the aforementioned range listed, please consider follow up with your Primary Care Provider.  If you are age 24 or younger, your body mass index should be between 19-25. Your Body mass index is 29.58 kg/m. If this is out of the aformentioned range listed, please consider follow up with your Primary Care Provider.    The week of October 4th come in for a repeat H. Pylori stool testing in the lab.    Follow up as needed.

## 2020-05-25 NOTE — Progress Notes (Signed)
Reviewed and agree with management plans. ? ?Flora Parks L. Addi Pak, MD, MPH  ?

## 2020-05-25 NOTE — Progress Notes (Signed)
Chief Complaint: Follow-up H. pylori  HPI:    Becky Cruz is a 49 year old female, assigned to Dr. Tarri Glenn, who returns to clinic today for follow-up after recent EGD with finding of H. pylori and colonoscopy.      04/08/2020 patient seen in clinic and described having diarrhea for about a month but this had stopped 2 weeks ago.  Occasional right upper quadrant pain.  Also a couple of days of vomiting up/passing some blood in her sputum.  At that time scheduled for an EGD and colonoscopy.  Also increased omeprazole to 40 mg twice daily.    04/16/2020 colonoscopy was completely normal, repeat recommended in 10 years.  EGD with mildly abnormal gastric mucosa.  Biopsies showed H. pylori.  Patient was given 14 days of both Smith subsalicylate 841 mg 4 times daily, metronidazole 500 mg 4 times daily, tetracycline 500 mg 4 times daily and pantoprazole 40 mg twice daily.  Was recommended she have a repeat H. pylori stool antigen 8 weeks after completing treatment.  Patient was advised to hold omeprazole 2 weeks before.    Today, the patient tells me she finished all of her medicines about 2 weeks ago and has had no further symptoms at all.  She feels like she has more energy and in general is just feeling better.    Denies fever, chills, blood in her stool, nausea, vomiting, heartburn, reflux or abdominal pain.  Past Medical History:  Diagnosis Date  . Anemia   . Dysrhythmia   . Pregnancy induced hypertension     Past Surgical History:  Procedure Laterality Date  . WISDOM TOOTH EXTRACTION      Current Outpatient Medications  Medication Sig Dispense Refill  . clobetasol (TEMOVATE) 0.05 % external solution Apply 1 application topically 2 (two) times daily. (Patient not taking: Reported on 04/16/2020) 100 mL 2  . omeprazole (PRILOSEC) 40 MG capsule Take 1 capsule (40 mg total) by mouth 2 (two) times daily before a meal. 60 capsule 3   No current facility-administered medications for this visit.     Allergies as of 05/25/2020  . (No Known Allergies)    Family History  Problem Relation Age of Onset  . Diabetes Maternal Grandmother   . Anesthesia problems Neg Hx   . Hypotension Neg Hx   . Malignant hyperthermia Neg Hx   . Pseudochol deficiency Neg Hx     Social History   Socioeconomic History  . Marital status: Married    Spouse name: Not on file  . Number of children: Not on file  . Years of education: Not on file  . Highest education level: Not on file  Occupational History  . Not on file  Tobacco Use  . Smoking status: Never Smoker  . Smokeless tobacco: Never Used  Substance and Sexual Activity  . Alcohol use: No  . Drug use: No  . Sexual activity: Yes    Birth control/protection: None, Condom  Other Topics Concern  . Not on file  Social History Narrative   Originally from [place].   Social Determinants of Health   Financial Resource Strain:   . Difficulty of Paying Living Expenses: Not on file  Food Insecurity:   . Worried About Charity fundraiser in the Last Year: Not on file  . Ran Out of Food in the Last Year: Not on file  Transportation Needs:   . Lack of Transportation (Medical): Not on file  . Lack of Transportation (Non-Medical): Not on file  Physical Activity:   . Days of Exercise per Week: Not on file  . Minutes of Exercise per Session: Not on file  Stress:   . Feeling of Stress : Not on file  Social Connections:   . Frequency of Communication with Friends and Family: Not on file  . Frequency of Social Gatherings with Friends and Family: Not on file  . Attends Religious Services: Not on file  . Active Member of Clubs or Organizations: Not on file  . Attends Archivist Meetings: Not on file  . Marital Status: Not on file  Intimate Partner Violence:   . Fear of Current or Ex-Partner: Not on file  . Emotionally Abused: Not on file  . Physically Abused: Not on file  . Sexually Abused: Not on file    Review of Systems:     Constitutional: No weight loss, fever or chills Skin: No rash Cardiovascular: No chest pain  Respiratory: No SOB  Gastrointestinal: See HPI and otherwise negative Genitourinary: No dysuria  Neurological: No headache, dizziness or syncope Musculoskeletal: No new muscle or joint pain Hematologic: No bleeding  Psychiatric: No history of depression or anxiety   Physical Exam:  Vital signs: BP 122/90   Pulse 68   Ht 5\' 3"  (1.6 m)   Wt 167 lb (75.8 kg)   LMP 11/25/2012 Comment: stopped breast feeding Nov2014  BMI 29.58 kg/m   Constitutional:   Pleasant AA female appears to be in NAD, Well developed, Well nourished, alert and cooperative Respiratory: Respirations even and unlabored. Lungs clear to auscultation bilaterally.   No wheezes, crackles, or rhonchi.  Cardiovascular: Normal S1, S2. No MRG. Regular rate and rhythm. No peripheral edema, cyanosis or pallor.  Gastrointestinal:  Soft, nondistended, nontender. No rebound or guarding. Normal bowel sounds. No appreciable masses or hepatomegaly. Psychiatric: Demonstrates good judgement and reason without abnormal affect or behaviors.  RELEVANT LABS AND IMAGING: CBC    Component Value Date/Time   WBC 7.8 03/07/2018 1441   WBC 4.7 11/19/2017 1215   WBC 6.3 02/06/2014 1218   RBC 4.78 03/07/2018 1441   RBC 4.65 11/19/2017 1215   RBC 4.55 02/06/2014 1218   HGB 14.1 03/07/2018 1441   HCT 41.8 03/07/2018 1441   PLT 229 03/07/2018 1441   MCV 87 03/07/2018 1441   MCH 29.5 03/07/2018 1441   MCH 28.8 11/19/2017 1215   MCH 28.6 02/06/2014 1218   MCHC 33.7 03/07/2018 1441   MCHC 33.1 11/19/2017 1215   MCHC 34.3 02/06/2014 1218   RDW 14.7 03/07/2018 1441   LYMPHSABS 1.4 03/07/2018 1441   MONOABS 0.5 05/06/2013 1050   EOSABS 0.4 03/07/2018 1441   BASOSABS 0.0 03/07/2018 1441    CMP     Component Value Date/Time   NA 140 12/05/2019 1617   K 4.1 12/05/2019 1617   CL 101 12/05/2019 1617   CO2 22 12/05/2019 1617   GLUCOSE 93  12/05/2019 1617   GLUCOSE 87 05/06/2013 1050   BUN 16 12/05/2019 1617   CREATININE 0.80 12/05/2019 1617   CREATININE 0.65 05/06/2013 1050   CALCIUM 9.4 12/05/2019 1617   PROT 7.1 12/05/2019 1617   ALBUMIN 4.4 12/05/2019 1617   AST 25 12/05/2019 1617   ALT 33 (H) 12/05/2019 1617   ALKPHOS 95 12/05/2019 1617   BILITOT 0.2 12/05/2019 1617   GFRNONAA 88 12/05/2019 1617   GFRAA 102 12/05/2019 1617    Assessment: 1.  H pylori gastritis: Completed treatment 2 weeks ago, no further symptoms  Plan:  1.  Reviewed recent EGD and colonoscopy.  Answered questions. 2.  Recommend the patient come in in 6 weeks for her H. pylori stool antigen to ensure that she has eradicated the bacteria.  Discussed that since she feels so much better and has no further symptoms she likely does not still have the infection. 3.  Patient to follow in clinic with [place] as needed in the future.  Ellouise Newer, PA-C [facility] 05/25/2020, 9:00 AM  Cc: Harvie Heck, MD

## 2020-06-10 ENCOUNTER — Ambulatory Visit: Payer: 59 | Admitting: Physician Assistant

## 2020-06-14 ENCOUNTER — Encounter: Payer: 59 | Admitting: Family Medicine

## 2020-07-01 ENCOUNTER — Ambulatory Visit (INDEPENDENT_AMBULATORY_CARE_PROVIDER_SITE_OTHER): Payer: 59 | Admitting: Student

## 2020-07-01 ENCOUNTER — Encounter: Payer: Self-pay | Admitting: Student

## 2020-07-01 ENCOUNTER — Other Ambulatory Visit: Payer: Self-pay

## 2020-07-01 ENCOUNTER — Ambulatory Visit (HOSPITAL_COMMUNITY): Admission: EM | Admit: 2020-07-01 | Discharge: 2020-07-01 | Discharge status: Against Medical Advice | Payer: 59

## 2020-07-01 ENCOUNTER — Telehealth: Payer: Self-pay

## 2020-07-01 VITALS — BP 130/80 | HR 91 | Temp 98.3°F | Ht 63.0 in | Wt 167.2 lb

## 2020-07-01 DIAGNOSIS — R519 Headache, unspecified: Secondary | ICD-10-CM | POA: Diagnosis not present

## 2020-07-01 DIAGNOSIS — R7303 Prediabetes: Secondary | ICD-10-CM

## 2020-07-01 DIAGNOSIS — R5383 Other fatigue: Secondary | ICD-10-CM | POA: Diagnosis not present

## 2020-07-01 LAB — POCT GLYCOSYLATED HEMOGLOBIN (HGB A1C): Hemoglobin A1C: 5.5 % (ref 4.0–5.6)

## 2020-07-01 LAB — GLUCOSE, CAPILLARY: Glucose-Capillary: 82 mg/dL (ref 70–99)

## 2020-07-01 NOTE — Patient Instructions (Addendum)
Becky Cruz,  It is a pleasure of getting to know you today. Here is a summary of what we talked about:  1. Head pressure and fatigue: We will send a referral to an eye doctor to rule out any visual problems that can cause your symptoms of head pressure. Please hydrate yourself and drink plenty of fluid. You can take Tylenol for pain as needed. Please come back to our clinic if symptoms are not better.  2. Blood pressure: Your blood pressure is within a good range. You can check your blood pressure once a day at the same time and keep a log. Bring it with you to the next visit.  Call us for any questions or concerns  Take care  Dr. Alfonse Spruce

## 2020-07-01 NOTE — Telephone Encounter (Signed)
TC received from patient, she c/o having high b/p's X 2 days.  She reports the following b/p's: 130/103 121/95 117/89 She states she is a "little light headed".  She denies any chest pain, denies SOB, no H/A, no ringing in the ears. Appt offered with PCP tomorrow, patient states she would like to be seen today because she is feeling light headed.  States she went to Urgent care and the wait is too long.  Appt made for today, yellow team/Dr. Alfonse Spruce at [facility], RN,BSN

## 2020-07-01 NOTE — ED Notes (Signed)
Pt did not answer.

## 2020-07-01 NOTE — Assessment & Plan Note (Addendum)
Patient complaints of head pressure and feeling fatigue in the last 3 days. Patient states that she does check her blood pressure every day and usually runs in the 120s/80s-90s. Her blood pressure baseline is 120/80. Patient denies lightheadedness, dizziness, vertigo, muscle weakness, numbness, slurred speech, facial drooping, chest pain, shortness of breath. Neuro exam was normal with no neurological deficit. Patient look well on exam.  Will rule out visual problems, which can be a common cause of headaches for head pressure. Referral to optometrist to rule out any visual issues. Patient also positive for orthostatic blood pressure. She states that she drinks only minimal amount of fluid aday. Advised patient to hydrate appropriately and drink plenty of fluid. Anemia is unlikely due to normal CBC in 2019. Other labs were also normal this year. Patient is also advised to take Tylenol for headache as needed.   Plan: -Increase fluid intake -Referral to optometry -Tylenol as needed for pain -Come back if symptoms not better

## 2020-07-01 NOTE — Progress Notes (Signed)
   CC: Head pressure and fatigue  HPI:  Ms.Delanna T Waldo is a 49 y.o. with history of family deficiency, alopecia, H pylori, thyroid nodule who presented to a clinic for chief complaint of head pressure and fatigue that started about 3 days ago.  Please see problem based charting for further detail  Past Medical History:  Diagnosis Date  . Anemia   . Dysrhythmia   . Pregnancy induced hypertension    Review of Systems: As per HPI  Physical Exam:  Vitals:   07/01/20 1510 07/01/20 1613  BP: (!) 141/85 130/80  Pulse: 91   Temp: 98.3 F (36.8 C)   TempSrc: Oral   SpO2: 100%   Weight: 167 lb 3.2 oz (75.8 kg)   Height: 5\' 3"  (1.6 m)    Physical Exam Constitutional:      General: She is not in acute distress. HENT:     Head: Normocephalic.  Eyes:     General: No scleral icterus.    Extraocular Movements: Extraocular movements intact.  Cardiovascular:     Rate and Rhythm: Normal rate and regular rhythm.     Heart sounds: No murmur heard.   Pulmonary:     Effort: No respiratory distress.     Breath sounds: Normal breath sounds. No wheezing.  Abdominal:     General: Bowel sounds are normal.  Musculoskeletal:        General: Normal range of motion.     Cervical back: Normal range of motion.  Skin:    General: Skin is warm.  Neurological:     Mental Status: She is alert.     Comments: +5 strength upper and lower extremity bilaterally Normal extraocular muscles movements  Psychiatric:        Mood and Affect: Mood normal.      Assessment & Plan:   See Encounters Tab for problem based charting.  Patient seen with Dr. Dareen Piano

## 2020-07-02 ENCOUNTER — Encounter: Payer: 59 | Admitting: Internal Medicine

## 2020-07-08 ENCOUNTER — Other Ambulatory Visit: Payer: 59

## 2020-07-08 DIAGNOSIS — A048 Other specified bacterial intestinal infections: Secondary | ICD-10-CM

## 2020-07-08 NOTE — Progress Notes (Signed)
Internal Medicine Clinic Attending  I saw and evaluated the patient.  I personally confirmed the key portions of the history and exam documented by Dr. Nguyen and I reviewed pertinent patient test results.  The assessment, diagnosis, and plan were formulated together and I agree with the documentation in the resident's note.\  

## 2020-07-09 LAB — HELICOBACTER PYLORI  SPECIAL ANTIGEN
MICRO NUMBER:: 11044430
SPECIMEN QUALITY: ADEQUATE

## 2020-07-22 ENCOUNTER — Ambulatory Visit: Payer: 59 | Admitting: Physician Assistant

## 2020-08-03 ENCOUNTER — Other Ambulatory Visit: Payer: Self-pay | Admitting: Physician Assistant

## 2020-10-14 ENCOUNTER — Ambulatory Visit: Payer: 59 | Admitting: Gastroenterology

## 2020-11-22 ENCOUNTER — Telehealth: Payer: Self-pay | Admitting: Internal Medicine

## 2020-11-22 NOTE — Telephone Encounter (Signed)
Returned call to patient. She is c/o extreme fatigue, dizziness, and stomach upset after eating x 10-14 days. Appt given tomorrow with Yellow Team at 0915. Hubbard Hartshorn, BSN, RN-BC

## 2020-11-22 NOTE — Telephone Encounter (Signed)
Pt hjaving  Having dizziness, Fatigue and Stomach pain x 2 wks.  Pls call back.

## 2020-11-23 ENCOUNTER — Other Ambulatory Visit: Payer: Self-pay

## 2020-11-23 ENCOUNTER — Encounter: Payer: Self-pay | Admitting: Student

## 2020-11-23 ENCOUNTER — Ambulatory Visit (INDEPENDENT_AMBULATORY_CARE_PROVIDER_SITE_OTHER): Payer: 59 | Admitting: Student

## 2020-11-23 VITALS — BP 115/83 | HR 81 | Temp 97.7°F | Ht 63.0 in | Wt 169.9 lb

## 2020-11-23 DIAGNOSIS — R5383 Other fatigue: Secondary | ICD-10-CM | POA: Diagnosis not present

## 2020-11-23 DIAGNOSIS — R109 Unspecified abdominal pain: Secondary | ICD-10-CM | POA: Insufficient documentation

## 2020-11-23 DIAGNOSIS — R1013 Epigastric pain: Secondary | ICD-10-CM | POA: Diagnosis not present

## 2020-11-23 DIAGNOSIS — H8110 Benign paroxysmal vertigo, unspecified ear: Secondary | ICD-10-CM

## 2020-11-23 DIAGNOSIS — R7401 Elevation of levels of liver transaminase levels: Secondary | ICD-10-CM

## 2020-11-23 DIAGNOSIS — K219 Gastro-esophageal reflux disease without esophagitis: Secondary | ICD-10-CM

## 2020-11-23 NOTE — Assessment & Plan Note (Signed)
Assessment: Patient states in the past she has been diagnosed with BPV that was well controlled with home exercises. She notes that every year in the spring time, her symptoms are exacerbated. She has been prescribed meclizine in the past, but did not take the medicine as she was hoping to not need it. She does have a history of allergies, which she takes claritin PRN for. I suspect that this pattern of vertigo exacerbation has to do with changes in weather and her seasonal allergies. Recommended she continue her vertigo exercises and take claritin as needed for allergies.   Plan: -continue BPV exercises -take claritin as needed for seasonal allergies

## 2020-11-23 NOTE — Assessment & Plan Note (Addendum)
Assessment: Becky Cruz presents with Hx of fatigue of unknown etiology. She notes that it has occurred for the past few years and that the underlying etiology is unknown. She states she has had multiple work ups in the past and was told they were negative. She describes the fatigue as intermittent and is described as feeling tired all of the time and immediately after waking in the morning. Denies muscle weakness. Uncertain as to what exacerbates her symptoms. Notes improvement with removing breads from her diet in the past as taking a women's multi-vitamin and laying down.  She denies trouble sleeping at night and states that her fatigue is not dependent on whether or not she has a good nights rest or not. She averages 6-8 hours of sleep a night. She gives examples of taking her children to school and coming home and being tired as well as being unable to finish chores around the house without feeling exhausted. She has to lay down and the act of this, will improve her symptoms. She only eats 1-2 small meals a day and states this is due to simply not being hungry at times. She gave an example meal of only eating three tiny pizza rolls yesterday and that was all she ate.   Will initiate work up with CBC, CMP, and TSH. Patient with history of multi-nodular goiter. Patient Stop-Bang score low, low suspicion sleep apnea occurring. Patient denies depression (PHQ-9 of 0) and patient denied feeling down or depressed recently. Low suspicion for autoimmunie etiology or myositis. If above tests unremarkable, consider vitamin deficiency testing in setting of poor PO intake.   Plan: -CBC, CMP, TSH -encouraged continuation of multi-vitamin daily -encouraged to eat daily as tolerable and try to have well balanced diet.   Addendum: CBC unremarkable, TSH normal, CMP unremarkable as well. At this time do not believe fatigue is secondary to anemia, electrolyte abnormalities, or thyroid. Suspect lack of diet may be  contributing to fatigue and encouraged patient to continue multivitamin if that helps with symptoms. Patient to follow up in 2-3 months or sooner if she prefers, needs physical exam for full assessment however preferred female provider. One was unavailable during the time of her examination.

## 2020-11-23 NOTE — Assessment & Plan Note (Signed)
Assessment: Upper abdominal discomfort for the past 2 days. She states it is intermittent and improves with food. She notes it improved yesterday after eating. She preferred I not exam her today as she prefers female providers for physical exams. Informed patient to return to clinic if it does not improve, I believe this will continue to resolve on it's own. Her endorsing improving of pain with food does raise some concern for duodenal ulcers. EGD in 04/2020 without duodenal ulcers present. I suspect this will self-resolve  Plan: -continue supportive treatment at home -return to clinic if no improvement or if pain persists.

## 2020-11-23 NOTE — Patient Instructions (Signed)
Thank you, Ms.Davina Poke for allowing [place] to provide your care today. Today we discussed   Fatigue - We will start with some blood work today. Checking your hemoglobin as well as electrolytes and thyroid test. I will ask that you schedule an appointment to see Dr. Marva Panda or another female provider in the coming weeks to discuss these results and give you a full examination.   For the vertigo, please continue your exercises and if you feel any allergy symptoms, please take your claritin.   For the acid reflux, we discussed non-medication techniques, avoiding spicy foods or foods that trigger your reflux. Do not eat 3 hours before bed time. If you are still having symptoms, I recommend you take your acid pill.   If the stomach pain worsens or does not improve, please call our clinic. If you believe you are having an emergency, please call 911 or go to your closest urgent care/emergency department.  I have ordered the following labs for you:   Lab Orders     CBC with Diff     CMP14 + Anion Gap     TSH    Referrals ordered today:   Referral Orders  No referral(s) requested today     I have ordered the following medication/changed the following medications:   Stop the following medications: There are no discontinued medications.   Start the following medications: No orders of the defined types were placed in this encounter.    Follow up: 2-3 months    Should you have any questions or concerns please call the internal medicine clinic at [phone].     Sanjuana Letters, D.O. [place]

## 2020-11-23 NOTE — Progress Notes (Signed)
   CC: fatigue, vertigo, acid reflux, stomach pain  HPI:  [name] is a 50 y.o. female with a past medical history stated below and presents today for fatigue, vertigo, acid reflux, stomach pain. Please see problem based assessment and plan for additional details.  Past Medical History:  Diagnosis Date  . Anemia   . Dysrhythmia   . Pregnancy induced hypertension     Current Outpatient Medications on File Prior to Visit  Medication Sig Dispense Refill  . clobetasol (TEMOVATE) 0.05 % external solution Apply 1 application topically 2 (two) times daily. 100 mL 2   No current facility-administered medications on file prior to visit.    Family History  Problem Relation Age of Onset  . Diabetes Maternal Grandmother   . Anesthesia problems Neg Hx   . Hypotension Neg Hx   . Malignant hyperthermia Neg Hx   . Pseudochol deficiency Neg Hx     Social History   Socioeconomic History  . Marital status: Married    Spouse name: Not on file  . Number of children: Not on file  . Years of education: Not on file  . Highest education level: Not on file  Occupational History  . Not on file  Tobacco Use  . Smoking status: Never Smoker  . Smokeless tobacco: Never Used  Substance and Sexual Activity  . Alcohol use: No  . Drug use: No  . Sexual activity: Yes    Birth control/protection: None, Condom  Other Topics Concern  . Not on file  Social History Narrative   Originally from [place].   Social Determinants of Health   Financial Resource Strain: Not on file  Food Insecurity: Not on file  Transportation Needs: Not on file  Physical Activity: Not on file  Stress: Not on file  Social Connections: Not on file  Intimate Partner Violence: Not on file    Review of Systems: ROS negative except for what is noted on the assessment and plan.  Vitals:   11/23/20 0944 11/23/20 0951  BP: (!) 142/75 115/83  Pulse: 85 81  Temp: 97.7 F (36.5 C)   TempSrc: Oral   SpO2: 100%    Weight: 169 lb 14.4 oz (77.1 kg)   Height: 5\' 3"  (1.6 m)    Physical Exam: Patient denied physical exam during this visit. She appeared comfortable and in no acute distress.   Assessment & Plan:   See Encounters Tab for problem based charting.  Patient discussed with Dr. Laurena Slimmer, D.O. [facility], PGY-1 Pager: [phone], Phone: [phone] Date 11/23/2020 Time 6:01 PM

## 2020-11-23 NOTE — Assessment & Plan Note (Addendum)
Assessment: Patient with PMHx of acid reflux. She states that it comes and goes and recently she has had worsened exacerbation of her reflux. Symptomatically it feels the same as it has in the past.  She has been prescribed a PPI but has taken it inconsistently as once her symptoms resolve she stops taking it. She notes certain foods trigger her reflux and she tries to avoid these. Her Sx improve with drinking milk. She would prefer to not take medication if possible, however, I encouraged her to continue to take PPI daily to help improve these symptoms. If she would not like to take it daily, she can continue to determine which foods cause her reflux to be exacerbated. Also discussed avoiding meals 3 hours before bedtime as well as sitting up right after meals.   Plan: -continue PPI -avoid foods that exacerbate symptoms -avoid eating within 3 hours of bed time.

## 2020-11-24 LAB — CMP14 + ANION GAP
ALT: 52 IU/L — ABNORMAL HIGH (ref 0–32)
AST: 28 IU/L (ref 0–40)
Albumin/Globulin Ratio: 1.6 (ref 1.2–2.2)
Albumin: 4.5 g/dL (ref 3.8–4.8)
Alkaline Phosphatase: 89 IU/L (ref 44–121)
Anion Gap: 16 mmol/L (ref 10.0–18.0)
BUN/Creatinine Ratio: 15 (ref 9–23)
BUN: 12 mg/dL (ref 6–24)
Bilirubin Total: 0.5 mg/dL (ref 0.0–1.2)
CO2: 20 mmol/L (ref 20–29)
Calcium: 9.8 mg/dL (ref 8.7–10.2)
Chloride: 102 mmol/L (ref 96–106)
Creatinine, Ser: 0.78 mg/dL (ref 0.57–1.00)
GFR calc Af Amer: 104 mL/min/{1.73_m2} (ref >59)
GFR calc non Af Amer: 90 mL/min/{1.73_m2} (ref >59)
Globulin, Total: 2.9 g/dL (ref 1.5–4.5)
Glucose: 92 mg/dL (ref 65–99)
Potassium: 4.2 mmol/L (ref 3.5–5.2)
Sodium: 138 mmol/L (ref 134–144)
Total Protein: 7.4 g/dL (ref 6.0–8.5)

## 2020-11-24 LAB — CBC WITH DIFFERENTIAL/PLATELET
Basophils Absolute: 0 10*3/uL (ref 0.0–0.2)
Basos: 1 %
EOS (ABSOLUTE): 0.2 10*3/uL (ref 0.0–0.4)
Eos: 4 %
Hematocrit: 41.6 % (ref 34.0–46.6)
Hemoglobin: 13.9 g/dL (ref 11.1–15.9)
Immature Grans (Abs): 0 10*3/uL (ref 0.0–0.1)
Immature Granulocytes: 0 %
Lymphocytes Absolute: 1.5 10*3/uL (ref 0.7–3.1)
Lymphs: 36 %
MCH: 29.4 pg (ref 26.6–33.0)
MCHC: 33.4 g/dL (ref 31.5–35.7)
MCV: 88 fL (ref 79–97)
Monocytes Absolute: 0.3 10*3/uL (ref 0.1–0.9)
Monocytes: 8 %
Neutrophils Absolute: 2.1 10*3/uL (ref 1.4–7.0)
Neutrophils: 51 %
Platelets: 247 10*3/uL (ref 150–450)
RBC: 4.73 x10E6/uL (ref 3.77–5.28)
RDW: 13 % (ref 11.7–15.4)
WBC: 4.1 10*3/uL (ref 3.4–10.8)

## 2020-11-24 LAB — TSH: TSH: 0.763 u[IU]/mL (ref 0.450–4.500)

## 2020-11-25 DIAGNOSIS — R7401 Elevation of levels of liver transaminase levels: Secondary | ICD-10-CM | POA: Insufficient documentation

## 2020-11-25 NOTE — Assessment & Plan Note (Signed)
Assessment: Patient with elevated ALT of 52, increased from prior CMP one year ago. Minimally elevated and without elevations of other hepatic specific labs, would recommend repeat CMP on subsequent visit to ensure resolution or if further hepatic work up is needed. Unable to exam patient so uncertain as to if she is having RUQ tenderness.   Plan: -repeat upon subsequent visit.

## 2020-11-25 NOTE — Progress Notes (Signed)
Internal Medicine Clinic Attending  Case discussed with Dr. Katsadouros  At the time of the visit.  We reviewed the resident's history and exam and pertinent patient test results.  I agree with the assessment, diagnosis, and plan of care documented in the resident's note.  

## 2021-01-19 ENCOUNTER — Encounter: Payer: Self-pay | Admitting: Internal Medicine

## 2021-01-19 ENCOUNTER — Other Ambulatory Visit: Payer: Self-pay

## 2021-01-19 ENCOUNTER — Ambulatory Visit (INDEPENDENT_AMBULATORY_CARE_PROVIDER_SITE_OTHER): Payer: 59 | Admitting: Internal Medicine

## 2021-01-19 VITALS — BP 124/84 | HR 90 | Temp 98.2°F | Ht 63.0 in | Wt 166.2 lb

## 2021-01-19 DIAGNOSIS — K219 Gastro-esophageal reflux disease without esophagitis: Secondary | ICD-10-CM | POA: Diagnosis not present

## 2021-01-19 DIAGNOSIS — R7401 Elevation of levels of liver transaminase levels: Secondary | ICD-10-CM | POA: Diagnosis not present

## 2021-01-19 DIAGNOSIS — R1013 Epigastric pain: Secondary | ICD-10-CM

## 2021-01-19 MED ORDER — FAMOTIDINE 20 MG PO TABS: 20.0000 mg | ORAL_TABLET | Freq: Every day | ORAL | 1 refills | Status: DC

## 2021-01-19 NOTE — Progress Notes (Signed)
   CC: Abnormal LFT, abdominal pain  HPI:  Becky Cruz is a 50 y.o. female with past medical history of H. pylori gastritis, nonalcoholic fatty liver disease, anemia, prediabetes presenting for evaluation of abnormal LFT and intermittent epigastric abdominal pain.  Please see problem based charting for complete assessment plan.  Past Medical History:  Diagnosis Date  . Anemia   . Dysrhythmia   . Pregnancy induced hypertension    Review of Systems: Negative except as stated in HPI.  Physical Exam:  Vitals:   01/19/21 1400 01/19/21 1403  BP: 132/89 124/84  Pulse: 84 90  Temp: 98.2 F (36.8 C)   TempSrc: Oral   SpO2: 100%   Weight: 166 lb 3.2 oz (75.4 kg)   Height: 5\' 3"  (1.6 m)    Physical Exam  Constitutional: Appears well-developed and well-nourished. No distress.  HENT: Normocephalic and atraumatic, EOMI, conjunctiva normal Cardiovascular: Normal rate, regular rhythm, S1 and S2 present, no murmurs, rubs, gallops.  Distal pulses intact Respiratory: No respiratory distress, no accessory muscle use.  Effort is normal.  Lungs are clear to auscultation bilaterally. GI: Nondistended, soft, minimal tenderness to palpation in the right upper quadrant, active bowel sounds Musculoskeletal: Normal bulk and tone.  No peripheral edema noted. Neurological: Is alert and oriented x4, no apparent focal deficits noted. Skin: Warm and dry.  No rash, erythema, lesions noted. Psychiatric: Normal mood and affect. Behavior is normal. Judgment and thought content normal.    Assessment & Plan:   See Encounters Tab for problem based charting.  Patient discussed with Dr. Dareen Piano

## 2021-01-19 NOTE — Assessment & Plan Note (Signed)
Patient with history of reflux disease with prior H. pylori gastritis that was confirmed on biopsy.  She has completed quadruple therapy.  She notes that she initially she did have relief of symptoms however, recently she has had more epigastric burning sensation.  She endorses diet that is low in fried food and spicy food and eats more fruits and vegetables and soups.  She is currently taking omeprazole 40 mg daily she denies any unintentional weight loss.  Plan Continue omeprazole 40 mg daily and add Pepcid 20 mg daily H. pylori stool antigen testing

## 2021-01-19 NOTE — Patient Instructions (Addendum)
Ms Becky Cruz,  It was a pleasure seeing you in clinic. Today we discussed:   Abdominal pain: I am repeating your labs today and have also ordered an abdominal ultrasound to further evaluate your liver.  For your persistent abdominal pain, you may continue to take omeprazole and add Pepcid 20mg  nightly. I would also like to re-check your stool for H.pylori. I will call you with any abnormal results.   If you have any questions or concerns, please call our clinic at [phone] between 9am-5pm and after hours call [phone] and ask for the internal medicine resident on call. If you feel you are having a medical emergency please call 911.   Thank you, we look forward to helping you remain healthy!  If you have not gotten the COVID vaccine, I recommend doing so:  You may get it at your local [facility] or [facility] OR To schedule an appointment for a COVID vaccine or be added to the vaccine wait list: Go to [email or web address]   OR Go to [email or web address]                  OR Call [phone]                                     OR Call [phone] and select Option 2

## 2021-01-19 NOTE — Assessment & Plan Note (Signed)
Patient was noted to have an elevated ALT of 52 at her prior visit.  She endorses intermittent right upper quadrant pain; however, notes that this is not very severe.  She does endorse a history of nonalcoholic fatty liver disease and has implemented lifestyle changes to decrease her weight.  On examination, mild right upper quadrant tenderness without any hepatomegaly appreciated.  Negative Murphy sign.  Fib 4 score 0.77  Plan Repeat CMP Right upper quadrant ultrasound

## 2021-01-20 ENCOUNTER — Other Ambulatory Visit: Payer: 59

## 2021-01-20 LAB — CMP14 + ANION GAP
ALT: 26 IU/L (ref 0–32)
AST: 17 IU/L (ref 0–40)
Albumin/Globulin Ratio: 1.6 (ref 1.2–2.2)
Albumin: 4.5 g/dL (ref 3.8–4.8)
Alkaline Phosphatase: 83 IU/L (ref 44–121)
Anion Gap: 18 mmol/L (ref 10.0–18.0)
BUN/Creatinine Ratio: 20 (ref 9–23)
BUN: 16 mg/dL (ref 6–24)
Bilirubin Total: 0.4 mg/dL (ref 0.0–1.2)
CO2: 19 mmol/L — ABNORMAL LOW (ref 20–29)
Calcium: 9.7 mg/dL (ref 8.7–10.2)
Chloride: 105 mmol/L (ref 96–106)
Creatinine, Ser: 0.8 mg/dL (ref 0.57–1.00)
Globulin, Total: 2.9 g/dL (ref 1.5–4.5)
Glucose: 95 mg/dL (ref 65–99)
Potassium: 4.2 mmol/L (ref 3.5–5.2)
Sodium: 142 mmol/L (ref 134–144)
Total Protein: 7.4 g/dL (ref 6.0–8.5)
eGFR: 90 mL/min/{1.73_m2} (ref >59)

## 2021-01-20 LAB — HEPATITIS C ANTIBODY: Hep C Virus Ab: <0.1 s/co ratio (ref 0.0–0.9)

## 2021-01-20 NOTE — Progress Notes (Signed)
Internal Medicine Clinic Attending ° °Case discussed with Dr. Aslam  At the time of the visit.  We reviewed the resident’s history and exam and pertinent patient test results.  I agree with the assessment, diagnosis, and plan of care documented in the resident’s note.  °

## 2021-01-22 LAB — H. PYLORI ANTIGEN, STOOL: H pylori Ag, Stl: NEGATIVE

## 2021-02-03 ENCOUNTER — Encounter: Payer: Self-pay | Admitting: Internal Medicine

## 2021-02-07 ENCOUNTER — Other Ambulatory Visit: Payer: Self-pay

## 2021-02-07 ENCOUNTER — Ambulatory Visit (HOSPITAL_BASED_OUTPATIENT_CLINIC_OR_DEPARTMENT_OTHER)
Admission: RE | Admit: 2021-02-07 | Discharge: 2021-02-07 | Disposition: A | Discharge status: Home / Self Care | Payer: 59 | Source: Ambulatory Visit | Attending: Internal Medicine | Admitting: Internal Medicine

## 2021-02-07 DIAGNOSIS — R7401 Elevation of levels of liver transaminase levels: Secondary | ICD-10-CM | POA: Insufficient documentation

## 2021-04-05 ENCOUNTER — Encounter: Payer: Self-pay | Admitting: *Deleted

## 2021-05-09 ENCOUNTER — Ambulatory Visit (INDEPENDENT_AMBULATORY_CARE_PROVIDER_SITE_OTHER): Payer: 59 | Admitting: Student

## 2021-05-09 VITALS — BP 149/95 | HR 86 | Temp 98.0°F | Ht 63.0 in | Wt 169.4 lb

## 2021-05-09 DIAGNOSIS — Z0000 Encounter for general adult medical examination without abnormal findings: Secondary | ICD-10-CM | POA: Diagnosis not present

## 2021-05-09 DIAGNOSIS — R1011 Right upper quadrant pain: Secondary | ICD-10-CM

## 2021-05-09 DIAGNOSIS — K8050 Calculus of bile duct without cholangitis or cholecystitis without obstruction: Secondary | ICD-10-CM

## 2021-05-09 DIAGNOSIS — R030 Elevated blood-pressure reading, without diagnosis of hypertension: Secondary | ICD-10-CM

## 2021-05-09 DIAGNOSIS — R7303 Prediabetes: Secondary | ICD-10-CM

## 2021-05-09 DIAGNOSIS — E042 Nontoxic multinodular goiter: Secondary | ICD-10-CM

## 2021-05-09 NOTE — Patient Instructions (Signed)
It was a pleasure meeting you today Mrs. Shaff.   Regarding your abdominal pain it is likely related to biliary colic and will require you to see a general surgeon. Expect a phone call to schedule this appointment if you do not hear from someone within 1-2 weeks please call our office.   Regarding your thyroid we will obtain labs and an Ultrasound.

## 2021-05-10 LAB — T4, FREE: Free T4: 1.22 ng/dL (ref 0.82–1.77)

## 2021-05-10 LAB — HEMOGLOBIN A1C
Est. average glucose Bld gHb Est-mCnc: 117 mg/dL
Hgb A1c MFr Bld: 5.7 % — ABNORMAL HIGH (ref 4.8–5.6)

## 2021-05-10 LAB — T3: T3, Total: 183 ng/dL — ABNORMAL HIGH (ref 71–180)

## 2021-05-10 LAB — TSH: TSH: 0.461 u[IU]/mL (ref 0.450–4.500)

## 2021-05-11 ENCOUNTER — Encounter: Payer: Self-pay | Admitting: Student

## 2021-05-11 DIAGNOSIS — Z0000 Encounter for general adult medical examination without abnormal findings: Secondary | ICD-10-CM | POA: Insufficient documentation

## 2021-05-11 NOTE — Progress Notes (Signed)
   CC: F/u abdominal pain   HPI:  [name] is a 50 y.o. female with past medical history per below. Please see assessment and plan under notes tab for further details.  Past Medical History:  Diagnosis Date   Anemia    Dysrhythmia    Pregnancy induced hypertension    Review of Systems: Negative except per assessment and plan under notes tab   Physical Exam:  Vitals:   05/09/21 1443  BP: (!) 149/95  Pulse: 86  Temp: 98 F (36.7 C)  SpO2: 100%  Weight: 169 lb 6.4 oz (76.8 kg)  Height: '5\' 3"'$  (1.6 m)   Gen: No acute distress CV: RRR, no murmurs or rubs Pulm: Non labored breathing on RA  Abd: Soft, NT, ND  Ext: No edema of BLE  Neuro: Non focal, grossly intact     Assessment & Plan:   See Encounters Tab for problem based charting.  Patient discussed with Dr. Daryll Cruz

## 2021-05-11 NOTE — Assessment & Plan Note (Signed)
Patient noted to have 5 nodules greater than 1 cm on thyroid ultrasound in 2019.  At this time she underwent FNA of 2 of these nodules which showed no evidence of malignancy.  Patient currently denies any compressive symptoms function assessed at this time.  Her thyroid function panel was within normal limits a thyroid ultrasound was ordered, we will follow-up these results.

## 2021-05-11 NOTE — Assessment & Plan Note (Signed)
Patient would like a Pap smear when she follows up with her PCP.  Result an order for screening mammogram was placed.  We also checked her A1c this clinic visit.

## 2021-05-11 NOTE — Assessment & Plan Note (Signed)
Patient reports that approximately 2 days ago she was having an uncomfortable feeling in her epigastrium after eating pizza. This pain resolved with the patient drinking an lime juice beverage. She feels this pain happens infrequently and has improved since she was treated for H pylori gastritis in 2021.   Patient also reports pain in the RUQ that occurs when she eats fatty foods. She had a recent RUQ [place] which showed moderate cholelithiasis, but no evidence of choledocholithiasis or acute cholecystitis.  A/P: Patients recent episode of feeling her stomach was upset and pain in the epigastrium is possibly 2/2 the greasy food that she ingested making her nauseous. Also considered viral gastroenteritis though patient denies any diarrhea or vomiting.   Regarding her persistent, intermittent RUQ abdominal pain that is precipitated by eating greasy foods, this is likely 2/2 biliary colic.  -Recommend the patient avoid fatty foods.  Place ambulatory general surgery consult

## 2021-05-11 NOTE — Assessment & Plan Note (Signed)
Patient's A1c this clinic visit is 5.7.  Counseled patient on increasing her activity as well as limiting sugary and fatty foods to help with this.

## 2021-05-11 NOTE — Assessment & Plan Note (Addendum)
Patient's blood pressure elevated this clinic visit.  She is currently not on any antihypertensives.  Upon review of the chart patient's blood pressure is typically WNL. Will continue to monitor for now.

## 2021-05-18 ENCOUNTER — Other Ambulatory Visit: Payer: Self-pay

## 2021-05-18 ENCOUNTER — Ambulatory Visit
Admission: RE | Admit: 2021-05-18 | Discharge: 2021-05-18 | Disposition: A | Discharge status: Home / Self Care | Payer: 59 | Source: Ambulatory Visit | Attending: Internal Medicine | Admitting: Internal Medicine

## 2021-05-18 DIAGNOSIS — E042 Nontoxic multinodular goiter: Secondary | ICD-10-CM

## 2021-05-18 DIAGNOSIS — Z0000 Encounter for general adult medical examination without abnormal findings: Secondary | ICD-10-CM

## 2021-05-20 ENCOUNTER — Other Ambulatory Visit: Payer: Self-pay | Admitting: Student

## 2021-05-20 DIAGNOSIS — E042 Nontoxic multinodular goiter: Secondary | ICD-10-CM

## 2021-05-21 NOTE — Progress Notes (Signed)
Internal Medicine Clinic Attending  Case discussed with Dr. Carter  At the time of the visit.  We reviewed the resident's history and exam and pertinent patient test results.  I agree with the assessment, diagnosis, and plan of care documented in the resident's note.  

## 2021-06-09 ENCOUNTER — Ambulatory Visit
Admission: RE | Admit: 2021-06-09 | Discharge: 2021-06-09 | Disposition: A | Discharge status: Home / Self Care | Payer: 59 | Source: Ambulatory Visit | Attending: Internal Medicine | Admitting: Internal Medicine

## 2021-06-09 ENCOUNTER — Other Ambulatory Visit (HOSPITAL_COMMUNITY)
Admission: RE | Admit: 2021-06-09 | Discharge: 2021-06-09 | Disposition: A | Discharge status: Home / Self Care | Payer: 59 | Source: Ambulatory Visit | Attending: Internal Medicine | Admitting: Internal Medicine

## 2021-06-09 DIAGNOSIS — D440 Neoplasm of uncertain behavior of thyroid gland: Secondary | ICD-10-CM | POA: Insufficient documentation

## 2021-06-09 DIAGNOSIS — E041 Nontoxic single thyroid nodule: Secondary | ICD-10-CM | POA: Diagnosis present

## 2021-06-09 DIAGNOSIS — E042 Nontoxic multinodular goiter: Secondary | ICD-10-CM

## 2021-06-10 LAB — CYTOLOGY - NON PAP

## 2021-07-06 ENCOUNTER — Encounter: Payer: Self-pay | Admitting: Student

## 2021-07-08 ENCOUNTER — Telehealth: Payer: Self-pay

## 2021-07-08 NOTE — Telephone Encounter (Signed)
Discussed biopsy results with patient via MyChart message.

## 2021-07-08 NOTE — Telephone Encounter (Signed)
Requesting to speak with Dr. Marva Panda about test results, please call back.

## 2021-07-14 ENCOUNTER — Ambulatory Visit (INDEPENDENT_AMBULATORY_CARE_PROVIDER_SITE_OTHER): Payer: 59 | Admitting: Internal Medicine

## 2021-07-14 VITALS — BP 136/93 | HR 94 | Temp 98.7°F | Wt 167.8 lb

## 2021-07-14 DIAGNOSIS — G8929 Other chronic pain: Secondary | ICD-10-CM

## 2021-07-14 DIAGNOSIS — M545 Low back pain, unspecified: Secondary | ICD-10-CM | POA: Diagnosis not present

## 2021-07-14 DIAGNOSIS — E042 Nontoxic multinodular goiter: Secondary | ICD-10-CM | POA: Diagnosis not present

## 2021-07-14 DIAGNOSIS — R300 Dysuria: Secondary | ICD-10-CM | POA: Diagnosis not present

## 2021-07-14 DIAGNOSIS — R1319 Other dysphagia: Secondary | ICD-10-CM | POA: Diagnosis not present

## 2021-07-14 DIAGNOSIS — R1310 Dysphagia, unspecified: Secondary | ICD-10-CM | POA: Insufficient documentation

## 2021-07-14 DIAGNOSIS — M549 Dorsalgia, unspecified: Secondary | ICD-10-CM | POA: Insufficient documentation

## 2021-07-14 MED ORDER — FLUTICASONE PROPIONATE 50 MCG/ACT NA SUSP: 1.0000 | Freq: Every day | NASAL | 2 refills | Status: DC

## 2021-07-14 NOTE — Assessment & Plan Note (Signed)
Patient reports approximately 10 days of sore throat with dysphagia. She also endorses associated jaw pain bilaterally. Notes that she has allergies and has been congested recently. Denies any fevers/chills or cough. Notes dysphagia is present with both solids and liquids. Denies any reflux symptoms at this time. No lymphadenopathy noted on exam; no signifiant pharyngeal erythema or cobblestoning noted. Suspect that her symptoms may be secondary to sinusitis/allergies.   Plan: Recommend for flonase 1 spray daily and continued OTC claritin If no significant improvement, will refer to GI for EGD given prior history of H pylori gastritis

## 2021-07-14 NOTE — Progress Notes (Signed)
   CC: dysphagia, thyroid nodule follow up  HPI:  [name] is a 50 y.o. female with PMHx as stated below presenting for evaluation of dysphagia, jaw pain, chronic back pain and dysuria. She is also following up on thyroid nodule at this time. Please see problem based charting for complete assessment and plan.  Past Medical History:  Diagnosis Date   Anemia    Dysrhythmia    Pregnancy induced hypertension    Review of Systems:  Negative except as stated in HPI.  Physical Exam:  Vitals:   07/14/21 1527  BP: (!) 136/93  Pulse: 94  Temp: 98.7 F (37.1 C)  TempSrc: Oral  SpO2: 99%  Weight: 167 lb 12.8 oz (76.1 kg)   Physical Exam  Constitutional: Appears well-developed and well-nourished. No distress.  HENT: Normocephalic and atraumatic, conjunctiva normal, no cervical or mandibular lymphadenopathy, no palpable goiter, moist mucous membranes Cardiovascular: Normal rate, regular rhythm, S1 and S2 present, no murmurs, rubs, gallops.  Distal pulses intact Respiratory: Lungs are clear to auscultation bilaterally. GI: Nondistended, soft, nontender to palpation, normal bowel sounds Musculoskeletal: Normal bulk and tone.  No peripheral edema noted. Lumbar paraspinal muscle tenderness, no CVA tenderness Neurological: Is alert and oriented x4, no apparent focal deficits noted. Skin: Warm and dry.  No rash, erythema, lesions noted. Psychiatric: Normal mood and affect.   Assessment & Plan:   See Encounters Tab for problem based charting.  Patient discussed with Dr. Evette Doffing

## 2021-07-14 NOTE — Assessment & Plan Note (Signed)
Patient has a history of multinodular goiter with prior nodules with benign findings. Most recent thyroid ultrasound with interval enlargement of a TI-RADS category 3 nodule in the left inferior gland that was biopsied in September. Cytology results [facility] 1 category (indeterminent). Patient expresses frustration with indeterminent findings at this time. She denies any other symptoms at this time; however, would like follow up thyroid studies.  Patient has had three FNA biopsies thus far for evaluation of multinodular goiter and would like to consider definite therapy at this time.   Plan: Repeat thyroid studies today FNA Biopsy ordered Referral to Endocrinology

## 2021-07-14 NOTE — Assessment & Plan Note (Signed)
Patient reports approximately 6 months of dysuria that occurs approximately 3-4 times per week. She denies any suprapubic pain, hematuria, pyuria. Notes that her urine is clear at this time.  Plan: Urinalysis  Continue to monitor; may be perimenopausal symptoms

## 2021-07-14 NOTE — Patient Instructions (Addendum)
Ms Becky Cruz,  It was a pleasure seeing you in clinic. Today we discussed:   Sore throat:  At this time, please continue symptomatic treatment for this with claritin and flonase for allergies. If this persists, please let us know. We may need to refer for further evaluation.   Thyroid nodules: Repeat thyroid studies at this visit. Repeat biopsy ordered. Referral to Endocrinology placed.    If you have any questions or concerns, please call our clinic at [phone] between 9am-5pm and after hours call [phone] and ask for the internal medicine resident on call. If you feel you are having a medical emergency please call 911.   Thank you, we look forward to helping you remain healthy!

## 2021-07-14 NOTE — Assessment & Plan Note (Signed)
Patient reports intermittent non-radiating lower back pain that has been present for several years. She notes that it some times makes it difficult for her to complete her daily tasks. Reports it feels like muscles are stiff and improvement with massage. On examination, does have muscle stiffness of paraspinal muscles of lumbar region. Denies any lower extremity weakness, saddle anesthesia, bowel or bladder incontinence.  Patient advised for heat therapy and stretching exercises  Plan:  Referral to physical therapy

## 2021-07-15 LAB — URINALYSIS, ROUTINE W REFLEX MICROSCOPIC
Bilirubin, UA: NEGATIVE
Glucose, UA: NEGATIVE
Ketones, UA: NEGATIVE
Leukocytes,UA: NEGATIVE
Nitrite, UA: NEGATIVE
Protein,UA: NEGATIVE
Specific Gravity, UA: 1.025 (ref 1.005–1.030)
Urobilinogen, Ur: 0.2 mg/dL (ref 0.2–1.0)
pH, UA: 5 (ref 5.0–7.5)

## 2021-07-15 LAB — TSH: TSH: 0.537 u[IU]/mL (ref 0.450–4.500)

## 2021-07-15 LAB — MICROSCOPIC EXAMINATION
Bacteria, UA: NONE SEEN
Casts: NONE SEEN /lpf
Epithelial Cells (non renal): NONE SEEN /hpf (ref 0–10)

## 2021-07-15 LAB — T3: T3, Total: 167 ng/dL (ref 71–180)

## 2021-07-15 LAB — T4, FREE: Free T4: 1.14 ng/dL (ref 0.82–1.77)

## 2021-07-15 NOTE — Progress Notes (Signed)
Internal Medicine Clinic Attending ° °Case discussed with Dr. Aslam  At the time of the visit.  We reviewed the resident’s history and exam and pertinent patient test results.  I agree with the assessment, diagnosis, and plan of care documented in the resident’s note.  °

## 2021-08-02 ENCOUNTER — Other Ambulatory Visit (HOSPITAL_COMMUNITY)
Admission: RE | Admit: 2021-08-02 | Discharge: 2021-08-02 | Disposition: A | Discharge status: Home / Self Care | Payer: 59 | Source: Ambulatory Visit | Attending: Radiology | Admitting: Radiology

## 2021-08-02 ENCOUNTER — Ambulatory Visit
Admission: RE | Admit: 2021-08-02 | Discharge: 2021-08-02 | Disposition: A | Discharge status: Home / Self Care | Payer: 59 | Source: Ambulatory Visit | Attending: Student in an Organized Health Care Education/Training Program | Admitting: Student in an Organized Health Care Education/Training Program

## 2021-08-02 DIAGNOSIS — E041 Nontoxic single thyroid nodule: Secondary | ICD-10-CM | POA: Insufficient documentation

## 2021-08-03 ENCOUNTER — Ambulatory Visit: Payer: 59 | Attending: Student in an Organized Health Care Education/Training Program | Admitting: Physical Therapy

## 2021-08-03 ENCOUNTER — Encounter: Payer: Self-pay | Admitting: Physical Therapy

## 2021-08-03 ENCOUNTER — Other Ambulatory Visit: Payer: Self-pay

## 2021-08-03 DIAGNOSIS — G8929 Other chronic pain: Secondary | ICD-10-CM | POA: Diagnosis present

## 2021-08-03 DIAGNOSIS — R293 Abnormal posture: Secondary | ICD-10-CM | POA: Insufficient documentation

## 2021-08-03 DIAGNOSIS — M6281 Muscle weakness (generalized): Secondary | ICD-10-CM | POA: Diagnosis present

## 2021-08-03 DIAGNOSIS — M545 Low back pain, unspecified: Secondary | ICD-10-CM | POA: Insufficient documentation

## 2021-08-03 LAB — CYTOLOGY - NON PAP

## 2021-08-03 NOTE — Therapy (Addendum)
[place], [place], [address] Phone: [phone]   Fax:  [phone]  Physical Therapy Evaluation/Discharge  Patient Details  Name: Becky Cruz MRN: [id number] Date of Birth: May 05, 1971 Referring Provider (PT): Dr Marcello Moores Lalla Brothers   Encounter Date: 08/03/2021   PT End of Session - 08/03/21 1159     Visit Number 1    Number of Visits 12    Date for PT Re-Evaluation 09/14/21    Authorization Type [employer]    PT Start Time 0849    PT Stop Time 0945    PT Time Calculation (min) 56 min    Activity Tolerance Patient tolerated treatment well    Behavior During Therapy [facility] for tasks assessed/performed             Past Medical History:  Diagnosis Date   Anemia    Dysrhythmia    Pregnancy induced hypertension     Past Surgical History:  Procedure Laterality Date   WISDOM TOOTH EXTRACTION      There were no vitals filed for this visit.    Subjective Assessment - 08/03/21 0852     Subjective Pt with chronic back pain (middle and lower) which has been ongoing for 5 years or more, daily and worsening in intensity.  "My kids massage it for me but it doesnt last" She cannot recall an injury or incident which may have caused it.  She has difficulty bending, prolonged sitting.  She denies weakness or sensory changes.    Pertinent History h/o HTN    Limitations Sitting;Lifting;House hold activities    Diagnostic tests none    Patient Stated Goals pain relief    Currently in Pain? Yes    Pain Score 5     Pain Location Back    Pain Orientation Right;Left    Pain Descriptors / Indicators Aching;Tightness    Pain Type Chronic pain    Pain Radiating Towards none    Pain Onset More than a month ago    Pain Frequency Constant    Aggravating Factors  sitting , bending, lifting    Pain Relieving Factors prone, massage    Effect of Pain on Daily Activities limit comfort    Multiple Pain Sites No                 [facility] PT Assessment - 08/03/21 0001       Assessment   Medical Diagnosis low back pain    Referring Provider (PT) Dr Axel Filler    Onset Date/Surgical Date --   chroinc   Prior Therapy No      Precautions   Precautions None      Restrictions   Weight Bearing Restrictions No      Balance Screen   Has the patient fallen in the past 6 months No      [place] residence    Living Arrangements Spouse/significant other;Children    Type of Home House      Prior Function   Level of [place] work    Leisure get out with kids, 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, eating out      Cognition   Overall Cognitive Status Within Functional Limits for tasks assessed      Observation/Other Assessments   Focus on Therapeutic Outcomes (FOTO)  63%      Sensation   Light Touch Appears Intact  Coordination   Gross Motor Movements are Fluid and Coordinated Not tested      Posture/Postural Control   Postural Limitations Increased lumbar lordosis      Strength   Right Hip Flexion 5/5    Left Hip Flexion 5/5    Right Knee Flexion 5/5    Right Knee Extension 5/5    Left Knee Flexion 5/5    Left Knee Extension 5/5      Flexibility   Soft Tissue Assessment /Muscle Length yes    Hamstrings tight      Palpation   Spinal mobility WNL    Palpation comment sore along bilateral paraspinals L3-L4      Special Tests   Other special tests neg SLR      Transfers   Five time sit to stand comments  12.8 sec              Objective measurements completed on examination: See above findings.          PT Education - 08/03/21 1159     Education Details PT/POC, HEP, spine mobility, posture    Person(s) Educated Patient    Methods Explanation;Handout    Comprehension Verbalized understanding;Returned demonstration                 PT Long Term Goals - 08/03/21 1324        PT LONG TERM GOAL #1   Title Pt will be I with HEP for core, posture and lifting    Time 6    Period Weeks    Status New    Target Date 09/14/21      PT LONG TERM GOAL #2   Title Pt will be able to sit with no increase in back pain for meals, short periods of time    Time 6    Period Weeks    Status New    Target Date 09/14/21      PT LONG TERM GOAL #3   Title Pt with be able to properly and safely lift light items from the floor without increased back pain .    Time 6    Period Weeks    Status New    Target Date 09/14/21      PT LONG TERM GOAL #4   Title Pt will increase FOTO score by 10% to demo functional improvement    Time 6    Period Weeks    Status New    Target Date 09/14/21                    Plan - 08/03/21 1814     Clinical Impression Statement Pt presents for low complexity eval of bilateral low back pain without sciatica that is chronic.  She has min stiffness of hips and lumbar spine, lacks core awareness and control.  She has increased spinal lordosis and poor body mechanics contributing to her pain .  She is not exercising and with core exercises and training, should do very well.    Personal Factors and Comorbidities Time since onset of injury/illness/exacerbation    Examination-Activity Limitations Carry;Stand;Sit;Squat    Examination-Participation Restrictions Cleaning;Interpersonal Relationship;Occupation    Stability/Clinical Decision Making Stable/Uncomplicated    Clinical Decision Making Low    Rehab Potential Excellent    PT Frequency 2x / week    PT Duration 6 weeks    PT Treatment/Interventions Cryotherapy;Electrical Stimulation;ADLs/Self Care Home Management;Moist Heat;Functional mobility training;Therapeutic exercise;Therapeutic activities;Dry needling;Neuromuscular re-education;Manual techniques;Patient/family education  PT Next Visit Plan check HEP , begin core in supine, q-ped. manual if needed    PT Home Exercise Plan [email or web address],  bridging , post pelvic tilt    Consulted and Agree with Plan of Care Patient             Patient will benefit from skilled therapeutic intervention in order to improve the following deficits and impairments:  Decreased mobility, Impaired flexibility, Increased fascial restricitons, Postural dysfunction, Pain, Improper body mechanics  Visit Diagnosis: Chronic bilateral low back pain without sciatica  Muscle weakness (generalized)  Abnormal posture     Problem List Patient Active Problem List   Diagnosis Date Noted   Dysphagia 07/14/2021   Dysuria 07/14/2021   Back pain 07/14/2021   Healthcare maintenance 05/11/2021   Elevated alanine aminotransferase (ALT) level 11/25/2020   Benign positional vertigo 11/23/2020   Fatigue 07/01/2020   Alopecia 01/28/2020   Multiple thyroid nodules 12/08/2019   Elevated BP without diagnosis of hypertension 12/08/2019   Gastroesophageal reflux disease 03/15/2018   Prediabetes 02/11/2018   Secondary amenorrhea 02/06/2014    Erin Obando, PT 08/03/2021, 6:26 PM  [facility] [address] [place], [place], [address] Phone: [phone]   Fax:  [phone]  Name: Becky Cruz MRN: [id number] Date of Birth: May 04, 1971  Raeford Razor, PT 08/03/21 6:27 PM Phone: [phone] Fax: [phone]

## 2021-08-03 NOTE — Patient Instructions (Signed)
Access Code: [email or web address] URL: [email or web address] Date: 08/03/2021 Prepared by: Raeford Razor  Exercises Supine Posterior Pelvic Tilt - 2 x daily - 7 x weekly - 2 sets - 10 reps - 5 hold Pilates Bridge - 2 x daily - 7 x weekly - 2 sets - 10 reps - 5 hold Cat Cow to Child's Pose - 2 x daily - 7 x weekly - 1 sets - 10 reps - 10-30 hold

## 2021-08-04 ENCOUNTER — Other Ambulatory Visit: Payer: Self-pay | Admitting: Student

## 2021-08-04 NOTE — Addendum Note (Signed)
Addended by: Riesa Pope on: 08/04/2021 09:07 AM   Modules accepted: Orders

## 2021-08-04 NOTE — Progress Notes (Signed)
Patient with non-diagnostic, Bethesda I FNA of thyroid nodule. This is the second fine needle aspiration where the pathology results were non-diagnostic. As such, the next steps would be US guided core needle biopsy. Will place order and call patient to update her.

## 2021-08-05 ENCOUNTER — Other Ambulatory Visit: Payer: Self-pay | Admitting: Student

## 2021-08-05 DIAGNOSIS — E042 Nontoxic multinodular goiter: Secondary | ICD-10-CM

## 2021-08-05 NOTE — Addendum Note (Signed)
Addended by: Riesa Pope on: 08/05/2021 03:24 PM   Modules accepted: Orders

## 2021-08-05 NOTE — Progress Notes (Addendum)
Patient with 2 fine-needle aspirations of thyroid nodule but have returned as Bethesda 1, nondiagnostic in nature.  Neck step recommended is core needle biopsy.  This was discussed with radiology who recommended a fine-needle aspiration with cytology.  We will replace the order with this.  We will call patient and update her.

## 2021-08-17 ENCOUNTER — Other Ambulatory Visit: Payer: Self-pay

## 2021-08-17 ENCOUNTER — Ambulatory Visit (HOSPITAL_COMMUNITY)
Admission: RE | Admit: 2021-08-17 | Discharge: 2021-08-17 | Disposition: A | Discharge status: Home / Self Care | Payer: 59 | Source: Ambulatory Visit | Attending: Student in an Organized Health Care Education/Training Program | Admitting: Student in an Organized Health Care Education/Training Program

## 2021-08-17 DIAGNOSIS — E042 Nontoxic multinodular goiter: Secondary | ICD-10-CM | POA: Insufficient documentation

## 2021-08-17 MED ORDER — LIDOCAINE-EPINEPHRINE 1 %-1:100000 IJ SOLN
INTRAMUSCULAR | Status: AC
  Filled 2021-08-17: qty 1

## 2021-08-17 NOTE — Procedures (Signed)
Pre Procedure Dx: Indeterminate left thyroid nodule Post Procedural Dx: Same  Technically successful US guided biopsy of indeterminate nodule within the inferior pole of the left lobe of the thyroid.   EBL: None  No immediate complications.   Ronny Bacon, MD Pager #: [phone]

## 2021-08-18 LAB — CYTOLOGY - NON PAP

## 2021-08-19 ENCOUNTER — Other Ambulatory Visit: Payer: Self-pay | Admitting: Student

## 2021-08-19 ENCOUNTER — Telehealth: Payer: Self-pay

## 2021-08-19 NOTE — Telephone Encounter (Signed)
Pt calling back again about her results. Pt states, "She should not have to wait for her results. The doctor should call her back immediately. Why have we not called back yet"

## 2021-08-19 NOTE — Telephone Encounter (Signed)
Return phone call to patient concerning her recent pathology results from her thyroid nodule fine-needle aspiration.  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  Explained to her that while in the results were abnormal, it was undetermined whether or not they were benign, suspicious or malignant pattern.  The sample was sent for molecular pattern testing and we are awaiting those results.  Plan patient uncertain as to how long these results will take but that they will help guide [place] on neck steps moving forward.  Patient was appreciative of phone call had no other questions at this time.

## 2021-08-19 NOTE — Telephone Encounter (Signed)
Requesting test results, please call pt back.  

## 2021-08-19 NOTE — Telephone Encounter (Signed)
RTC to patient about results.  Patient was informed that a message has been sent to the doctors to call her with her results.  Patient was adamant about the protocol for receiving results.  Patient was again informed that a doctor will call her with her results.  Her call may come as soon as this afternoon.  Patient stated that she will wait for a call from the doctor with her results. Sander Nephew, RN 08/19/2021 12:16 PM

## 2021-09-08 ENCOUNTER — Telehealth: Payer: Self-pay | Admitting: *Deleted

## 2021-09-08 ENCOUNTER — Other Ambulatory Visit: Payer: Self-pay | Admitting: *Deleted

## 2021-09-08 NOTE — Telephone Encounter (Addendum)
Call from patient asking for results of her further testing from a previous biopsy.  Patient was informed that a message would be sent to Dr. Johnney Ou to see if results are in.  Patient also stated that a referral was to be made to Endocrinology and she is waiting for the appointment.  Message from Dr. Johnney Ou that results are still not back.  Endocrinology  referral was done and accepted.  Patient was given number to call [facility] to get the appointment scheduled.  Patient voiced understanding of and will await call about the results when available.

## 2021-09-08 NOTE — Telephone Encounter (Signed)
The endocrinology referral was placed in October, I am uncertain as to why this has yet to be scheduled. The last update I see is on 10/13 and says the request was put in the WQ for [facility]. I will send a message to [place] about this.   In regards to her test results, it does not appear as though the afirma testing has come back yet.

## 2021-09-08 NOTE — Telephone Encounter (Incomplete Revision)
Call from patient asking for results of her further testing from a previous biopsy.  Patient was informed that a message would be sent to Dr. Johnney Ou to see if results are in.  Patient also stated that a referral was to be made to Endocrinology and she is waiting for the appointment.  Message from Dr. Johnney Ou that results are still not back.  Endocrinology  referral was done and accepted.  Patient was given number to call [facility] to get the appointment scheduled.  Patient voiced understanding of and will await call about the results when available.

## 2021-09-12 ENCOUNTER — Encounter (HOSPITAL_COMMUNITY): Payer: Self-pay

## 2021-09-16 ENCOUNTER — Telehealth: Payer: Self-pay

## 2021-09-16 NOTE — Telephone Encounter (Signed)
Attempted to call patient, voicemail left. Will try calling back again later today.

## 2021-09-16 NOTE — Telephone Encounter (Signed)
Requesting to speak with Dr. Marva Panda about test results, please call pt back.

## 2021-09-16 NOTE — Telephone Encounter (Signed)
Discussed with patient the results of her thyroid nodule biopsy.  Results were a benign nodule with 4% risk of transformation to malignancy.  These findings were discussed with the patient and she will continue to follow-up with endocrinology.  The referral has been placed and the patient was given the number to the endocrinology office to call and schedule this appointment moving forward.  Patient had no further questions after our discussion.

## 2021-12-13 ENCOUNTER — Ambulatory Visit (INDEPENDENT_AMBULATORY_CARE_PROVIDER_SITE_OTHER): Payer: 59 | Admitting: Internal Medicine

## 2021-12-13 ENCOUNTER — Other Ambulatory Visit: Payer: Self-pay

## 2021-12-13 ENCOUNTER — Encounter: Payer: Self-pay | Admitting: Internal Medicine

## 2021-12-13 VITALS — BP 159/95 | HR 79 | Temp 98.5°F | Ht 63.0 in | Wt 168.9 lb

## 2021-12-13 DIAGNOSIS — R5383 Other fatigue: Secondary | ICD-10-CM

## 2021-12-13 DIAGNOSIS — E860 Dehydration: Secondary | ICD-10-CM | POA: Diagnosis not present

## 2021-12-13 DIAGNOSIS — E878 Other disorders of electrolyte and fluid balance, not elsewhere classified: Secondary | ICD-10-CM

## 2021-12-13 DIAGNOSIS — R42 Dizziness and giddiness: Secondary | ICD-10-CM | POA: Diagnosis not present

## 2021-12-13 DIAGNOSIS — K219 Gastro-esophageal reflux disease without esophagitis: Secondary | ICD-10-CM

## 2021-12-13 MED ORDER — FAMOTIDINE 20 MG PO TABS: 20.0000 mg | ORAL_TABLET | Freq: Every day | ORAL | 1 refills | Status: DC

## 2021-12-13 NOTE — Patient Instructions (Signed)
Becky Cruz ? ?It was nice to meet you toady.  ? ?I think you had a viral infection which caused many of the symptoms that you experiences, including whole body pain and dizziness from dehydration and not eating enough. Please drink lots and lots of water and start eating 2-3 meals a day rather than 1 meal to see how you improve.  ? ?We are checking lab work to see if your blood count is low and if there is any electrolyte problems-- I will call you with these results.  ?

## 2021-12-13 NOTE — Assessment & Plan Note (Addendum)
Acute visit today for weakness, fatigue (chronic), myalgia's and dizziness x 10 days. She reports all of these sxs with some sinus congestion during the same time. No sick contacts. She denies having headaches, fevers, chills, N/V, or diarrhea during this time. Vital on presentation showing she is orthostatic, with drop in BP from 157/83 to 128/92 from lying to standing. She reports having a cup of coffee, 1 water bottle, and a bagel so far today. She ordinarily drink 2 bottles of water a day and has only 1 substantial meal a day. She has had previous workup for fatigue including unremarkable TSH, CBC, CMP and repleted Vitamin D. I suspect that these acute symptoms and orthostatic hypotension is 2/2 dehydration likely in setting of acute viral infection, and sxs should continue to improve with increased oral hydration and more frequent meals. I explained this to her, and she is agreeable to this plan. She denies melena and hematochezia, or any other form of blood loss, however will obtain CBC today to trend her Hgb and see if she may be having occult blood loss. Will also check a BMP for electrolytes and renal function in setting of dehydration.  ? ?Increase oral hydration  ?Increase frequency of meals ?F/u CBC and BMP ? ?Addendum: ?CBC wnl  ?BMP wnl with normal renal function and no electrolyte abnormalities.  ?

## 2021-12-13 NOTE — Progress Notes (Signed)
? ?CC: acute visit for fatigue and dizziness  ? ?HPI: ? ?[name] is a 51 y.o. female with a PMHx stated below and presents today for stated above. Please see the Encounters tab for problem-based Assessment & Plan for additional details.  ? ?Past Medical History:  ?Diagnosis Date  ? Anemia   ? Dysrhythmia   ? Pregnancy induced hypertension   ? ? ?Current Outpatient Medications on File Prior to Visit  ?Medication Sig Dispense Refill  ? acetaminophen (TYLENOL) 500 MG tablet Take 500 mg by mouth every 6 (six) hours as needed for moderate pain.    ? ascorbic acid (VITAMIN C) 500 MG tablet Take 500 mg by mouth every other day.    ? Cholecalciferol (VITAMIN D) 50 MCG (2000 UT) tablet Take 4,000 Units by mouth every other day.    ? clobetasol (TEMOVATE) 0.05 % external solution Apply 1 application topically 2 (two) times daily. (Patient not taking: No sig reported) 100 mL 2  ? fluticasone (FLONASE) 50 MCG/ACT nasal spray Place 1 spray into both nostrils daily. (Patient taking differently: Place 1 spray into both nostrils daily as needed for allergies.) 11.1 mL 2  ? loratadine (CLARITIN) 10 MG tablet Take 10 mg by mouth daily as needed for allergies.    ? Multiple Vitamin (MULTIVITAMIN WITH MINERALS) TABS tablet Take 1 tablet by mouth daily.    ? ?No current facility-administered medications on file prior to visit.  ? ? ?Family History  ?Problem Relation Age of Onset  ? Diabetes Maternal Grandmother   ? Anesthesia problems Neg Hx   ? Hypotension Neg Hx   ? Malignant hyperthermia Neg Hx   ? Pseudochol deficiency Neg Hx   ? ? ?Social History  ? ?Socioeconomic History  ? Marital status: Married  ?  Spouse name: Not on file  ? Number of children: Not on file  ? Years of education: Not on file  ? Highest education level: Not on file  ?Occupational History  ? Not on file  ?Tobacco Use  ? Smoking status: Never  ? Smokeless tobacco: Never  ?Substance and Sexual Activity  ? Alcohol use: No  ? Drug use: No  ? Sexual activity:  Yes  ?  Birth control/protection: None, Condom  ?Other Topics Concern  ? Not on file  ?Social History Narrative  ? Originally from [place].  ? ?Social Determinants of Health  ? ?Financial Resource Strain: Not on file  ?Food Insecurity: Not on file  ?Transportation Needs: Not on file  ?Physical Activity: Not on file  ?Stress: Not on file  ?Social Connections: Not on file  ?Intimate Partner Violence: Not on file  ? ? ?Review of Systems: ?ROS negative except for what is noted on the assessment and plan. ? ?Vitals:  ? 12/13/21 1526  ?BP: (!) 159/95  ?Pulse: 79  ?Temp: 98.5 ?F (36.9 ?C)  ?TempSrc: Oral  ?SpO2: 95%  ?Weight: 168 lb 14.4 oz (76.6 kg)  ?Height: '5\' 3"'$  (1.6 m)  ? ? ?Physical Exam: ?Constitutional: alert, well-appearing, in NAD ?HENT: normocephalic, atraumatic, mucous membranes moist ?Eyes: conjunctiva non-erythematous, EOMI ?Cardiovascular: RRR, no m/r/g, non-edematous bilateral LE ?Pulmonary/Chest: normal work of breathing on RA, LCTAB ?Abdominal: soft, non-tender to palpation, non-distended ?MSK: normal bulk and tone  ?Neurological: A&O x 3, 5/5 strength in bilateral upper and lower extremities, and follows commands  ?Skin: warm and dry  ?Psych: normal behavior, normal affect  ? ?Assessment & Plan:  ? ?See Encounters Tab for problem based charting. ? ?  Patient discussed with Dr. Saverio Danker ? ?Lajean Manes, MD  ?Internal Medicine Resident, PGY-1 ?[facility]  ?

## 2021-12-14 ENCOUNTER — Telehealth: Payer: Self-pay | Admitting: Internal Medicine

## 2021-12-14 ENCOUNTER — Encounter: Payer: Self-pay | Admitting: Internal Medicine

## 2021-12-14 LAB — BMP8+ANION GAP
Anion Gap: 18 mmol/L (ref 10.0–18.0)
BUN/Creatinine Ratio: 16 (ref 9–23)
BUN: 12 mg/dL (ref 6–24)
CO2: 20 mmol/L (ref 20–29)
Calcium: 9.3 mg/dL (ref 8.7–10.2)
Chloride: 103 mmol/L (ref 96–106)
Creatinine, Ser: 0.75 mg/dL (ref 0.57–1.00)
Glucose: 98 mg/dL (ref 70–99)
Potassium: 3.9 mmol/L (ref 3.5–5.2)
Sodium: 141 mmol/L (ref 134–144)
eGFR: 98 mL/min/{1.73_m2} (ref >59)

## 2021-12-14 LAB — CBC
Hematocrit: 40.4 % (ref 34.0–46.6)
Hemoglobin: 13.3 g/dL (ref 11.1–15.9)
MCH: 29 pg (ref 26.6–33.0)
MCHC: 32.9 g/dL (ref 31.5–35.7)
MCV: 88 fL (ref 79–97)
Platelets: 248 10*3/uL (ref 150–450)
RBC: 4.59 x10E6/uL (ref 3.77–5.28)
RDW: 12.8 % (ref 11.7–15.4)
WBC: 3.9 10*3/uL (ref 3.4–10.8)

## 2021-12-15 NOTE — Progress Notes (Signed)
Internal Medicine Clinic Attending  Case discussed with Dr. Patel  At the time of the visit.  We reviewed the resident's history and exam and pertinent patient test results.  I agree with the assessment, diagnosis, and plan of care documented in the resident's note.  

## 2022-01-03 NOTE — Progress Notes (Signed)
? ? ?Name: Becky Cruz  ?MRN/ DOB: [id number], [birth year]    ?Age/ Sex: 51 y.o., female   ? ?PCP: Harvie Heck, MD   ?Reason for Endocrinology Evaluation: MNG  ?   ?Date of Initial Endocrinology Evaluation: 01/04/2022   ? ? ?HPI: ?Ms. Becky Cruz is a 51 y.o. female with a past medical history of multinodular goiter, benign positional vertigo, GERD. The patient presented for initial endocrinology clinic visit on 01/04/2022 for consultative assistance with her MNG.  ? ?Patient has been diagnosed with multinodular goiter in 2019.  She is status post FNA of the left inferior 2.6 cm nodule with scant follicular epithelium (Bethesda category I).  Repeat FNA on 08/02/2021 again showed scant follicular epithelium.  A third attempt was made to biopsy the left inferior thyroid nodule on 08/17/2021 with a cytology report consistent with follicular lesion of undetermined significance (Bethesda category III) but Afirma came back as benign  ? ? ?Today she has stable local neck swelling  ?Denies dysphagia  ?Denies constipation or diarrhea  ?Denies palpitations  ?Denies tremors  ?Denies prior exposure to radiation  ?No biotin  ? ? ?Mother with thyroid disease  ? ?Has 4 kids between the ages 76-21 ?  ? ? ?HISTORY:  ?Past Medical History:  ?Past Medical History:  ?Diagnosis Date  ? Anemia   ? Dysrhythmia   ? Pregnancy induced hypertension   ? ?Past Surgical History:  ?Past Surgical History:  ?Procedure Laterality Date  ? WISDOM TOOTH EXTRACTION    ?  ?Social History:  reports that she has never smoked. She has never used smokeless tobacco. She reports that she does not drink alcohol and does not use drugs. ?Family History: family history includes Diabetes in her maternal grandmother. ? ? ?HOME MEDICATIONS: ?Allergies as of 01/04/2022   ?No Known Allergies ?  ? ?  ?Medication List  ?  ? ?  ? Accurate as of January 04, 2022  7:33 AM. If you have any questions, ask your nurse or doctor.  ?  ?  ? ?  ? ?acetaminophen 500 MG tablet ?Commonly known  as: TYLENOL ?Take 500 mg by mouth every 6 (six) hours as needed for moderate pain. ?  ?ascorbic acid 500 MG tablet ?Commonly known as: VITAMIN C ?Take 500 mg by mouth every other day. ?  ?clobetasol 0.05 % external solution ?Commonly known as: TEMOVATE ?Apply 1 application topically 2 (two) times daily. ?  ?famotidine 20 MG tablet ?Commonly known as: PEPCID ?Take 1 tablet (20 mg total) by mouth daily. ?  ?fluticasone 50 MCG/ACT nasal spray ?Commonly known as: FLONASE ?Place 1 spray into both nostrils daily. ?What changed:  ?when to take this ?reasons to take this ?  ?loratadine 10 MG tablet ?Commonly known as: CLARITIN ?Take 10 mg by mouth daily as needed for allergies. ?  ?multivitamin with minerals Tabs tablet ?Take 1 tablet by mouth daily. ?  ?Vitamin D 50 MCG (2000 UT) tablet ?Take 4,000 Units by mouth every other day. ?  ? ?  ?  ? ? ?REVIEW OF SYSTEMS: ?A comprehensive ROS was conducted with the patient and is negative except as per HPI and below:  ?ROS  ? ? ? ?OBJECTIVE:  ?VS: LMP 11/25/2012 Comment: stopped breast feeding Nov2014  ? ?Wt Readings from Last 3 Encounters:  ?12/13/21 168 lb 14.4 oz (76.6 kg)  ?07/14/21 167 lb 12.8 oz (76.1 kg)  ?05/09/21 169 lb 6.4 oz (76.8 kg)  ? ? ? ?EXAM: ?General: Pt  appears well and is in NAD  ?Neck: General: Supple without adenopathy. ?Thyroid: Bilateral nodules  appreciated R>L . No thyroid bruit.  ?Lungs: Clear with good BS bilat with no rales, rhonchi, or wheezes  ?Heart: Auscultation: RRR.  ?Abdomen: Normoactive bowel sounds, soft, nontender, without masses or organomegaly palpable  ?Extremities:  ?BL LE: No pretibial edema normal ROM and strength.  ?Mental Status: Judgment, insight: Intact ?Orientation: Oriented to time, place, and person ?Mood and affect: No depression, anxiety, or agitation  ? ? ? ?DATA REVIEWED: ? Latest Reference Range & Units 07/14/21 16:19  ?TSH 0.450 - 4.500 uIU/mL 0.537  ?Triiodothyronine (T3) 71 - 180 ng/dL 167  ?T4,Free(Direct) 0.82 - 1.77  ng/dL 1.14  ? ?  ? ?Thyroid ultrasound 05/18/2021 ?Estimated total number of nodules >/= 1 cm: 6-10 ?  ?Number of spongiform nodules >/=  2 cm not described below (TR1): 0 ?  ?Number of mixed cystic and solid nodules >/= 1.5 cm not described ?below ([place]): 0 ?  ?_________________________________________________________ ?  ?Nodule #1: The previously biopsied nodule in the lateral aspect of ?the right mid gland remains essentially unchanged at 2.0 x 1.6 x 0.9 ?cm compared to 1.9 x 1.5 x 1.0 cm previously. The nodule remains ?hypoechoic, solid and mildly exophytic from the anterior aspect of ?the gland. ?  ?_________________________________________________________ ?  ?Nodule #3 and #4: Small isoechoic solid nodules in the right mid and ?right inferior gland measuring no more than 1.4 cm in diameter. ?These both demonstrate slight interval regression compared to prior ?imaging and no longer meet criteria for further evaluation. ?  ?_________________________________________________________ ?  ?Nodule # 5: Slowly enlarging mixed cystic and solid nodule occupying ?the majority of the left mid gland now measures 4.0 x 3.2 x 3.0 cm. ?This TI-RADS category 2 nodule remains low risk and does not warrant ?further evaluation. ?  ?_________________________________________________________ ?  ?Nodule # 6: ?  ?Prior biopsy: No ?  ?Location: Left; Inferior ?  ?Maximum size: 2.6 cm; Other 2 dimensions: 2.5 x 1.9 cm, previously, ?1.6 x 1.5 x 1.3 cm ?  ?Composition: solid/almost completely solid (2) ?  ?Echogenicity: isoechoic (1) ?  ?Shape: not taller-than-wide (0) ?  ?Margins: ill-defined (0) ?  ?Echogenic foci: none (0) ?  ?ACR TI-RADS total points: 3. ?  ?ACR TI-RADS risk category:  TR3 (3 points). ?  ?Significant change in size (>/= 20% in two dimensions and minimal ?increase of 2 mm): Yes ?  ?Change in features: No ?  ?Change in ACR TI-RADS risk category: No ?  ?ACR TI-RADS recommendations: ?  ?**Given size (>/= 2.5 cm) and  appearance, fine needle aspiration of ?this mildly suspicious nodule should be considered based on TI-RADS ?criteria. ?  ?_________________________________________________________ ?  ?IMPRESSION: ?1. No significant interval change in the size or appearance of the ?previously biopsied nodule in the right mid gland (labeled #1). ?Recommend correlation with prior biopsy results. ?2. Interval enlargement of a TI-RADS category 3 nodule in the left ?inferior gland (labeled #6 above) which now meets criteria to ?consider fine-needle aspiration by segment. ?3. Interval decrease in size of TI-RADS category 3 nodules in the ?right mid and lower gland (labeled #3 and #4) which no longer meet ?criteria for further surveillance. ?4. No new nodules. ? ? ? ?ASSESSMENT/PLAN/RECOMMENDATIONS:  ? ?Multinodular goiter: ? ? ?-Patient is clinically and biochemically euthyroid ?-No local neck symptoms ?-She is status post FNA of the left inferior 2.6 cm nodule, with atypia of undetermined significance but Afirma came back benign ?-I  suspect the reason for atypia on cytology report is due to the fact that she had a prior FNA within weeks apart ?-The rest of her nodules have either remained stable or showed regression ?-She does have a 4 cm left thyroid nodule, on ultrasound this did not meet FNA criteria due to low risk, patient understand that she has the option to proceed with surgical intervention given the size.  ?-Patient is not interested in any surgical intervention and if we notice any increase in size or if she develops compressive symptoms she would be more interested in either radiofrequency ablation versus percutaneous ethanol injections ? ? ?Follow-up 7 months ?Signed electronically by: ?Abby Nena Jordan, MD ? ?[facility]  ?[facility] ?[address] ?[place], [place] [address] ?Phone: [phone] ?FAX: [phone] ? ? ?CC: ?Harvie Heck, MD ?[address] ?[place]  [address] ?Phone: [phone] ?Fax: [phone] ? ? ?Return to Endocrinology clinic as below: ?Future Appointments  ?Date Time Provider [place]  ?01/04/2022 10:30 AM Verginia Toohey, Melanie Crazier, MD [facility]

## 2022-01-04 ENCOUNTER — Encounter: Payer: Self-pay | Admitting: Internal Medicine

## 2022-01-04 ENCOUNTER — Ambulatory Visit (INDEPENDENT_AMBULATORY_CARE_PROVIDER_SITE_OTHER): Payer: 59 | Admitting: Internal Medicine

## 2022-01-04 VITALS — BP 130/70 | HR 80 | Ht 63.0 in | Wt 167.0 lb

## 2022-01-04 DIAGNOSIS — E042 Nontoxic multinodular goiter: Secondary | ICD-10-CM | POA: Diagnosis not present

## 2022-01-04 NOTE — Patient Instructions (Signed)
Radiofrequency ablation and percutaneous ethanol injections are non-invasive options for thyroid nodules  ?

## 2022-07-06 ENCOUNTER — Other Ambulatory Visit: Payer: Self-pay | Admitting: Student

## 2022-07-06 DIAGNOSIS — Z1231 Encounter for screening mammogram for malignant neoplasm of breast: Secondary | ICD-10-CM

## 2022-07-26 ENCOUNTER — Other Ambulatory Visit: Payer: Self-pay

## 2022-07-26 ENCOUNTER — Encounter: Payer: Self-pay | Admitting: Internal Medicine

## 2022-07-26 ENCOUNTER — Ambulatory Visit (INDEPENDENT_AMBULATORY_CARE_PROVIDER_SITE_OTHER): Payer: Commercial Managed Care - HMO | Admitting: Internal Medicine

## 2022-07-26 VITALS — BP 155/96 | HR 77 | Temp 98.1°F | Ht 63.0 in | Wt 169.9 lb

## 2022-07-26 DIAGNOSIS — R7303 Prediabetes: Secondary | ICD-10-CM | POA: Diagnosis not present

## 2022-07-26 DIAGNOSIS — R42 Dizziness and giddiness: Secondary | ICD-10-CM

## 2022-07-26 DIAGNOSIS — H8110 Benign paroxysmal vertigo, unspecified ear: Secondary | ICD-10-CM

## 2022-07-26 DIAGNOSIS — K760 Fatty (change of) liver, not elsewhere classified: Secondary | ICD-10-CM

## 2022-07-26 DIAGNOSIS — H9313 Tinnitus, bilateral: Secondary | ICD-10-CM

## 2022-07-26 DIAGNOSIS — Z124 Encounter for screening for malignant neoplasm of cervix: Secondary | ICD-10-CM

## 2022-07-26 DIAGNOSIS — E042 Nontoxic multinodular goiter: Secondary | ICD-10-CM

## 2022-07-26 DIAGNOSIS — R030 Elevated blood-pressure reading, without diagnosis of hypertension: Secondary | ICD-10-CM

## 2022-07-26 LAB — POCT GLYCOSYLATED HEMOGLOBIN (HGB A1C): Hemoglobin A1C: 6 % — AB (ref 4.0–5.6)

## 2022-07-26 LAB — GLUCOSE, CAPILLARY: Glucose-Capillary: 82 mg/dL (ref 70–99)

## 2022-07-26 NOTE — Patient Instructions (Addendum)
Thank you, Ms.Davina Poke for allowing [place] to provide your care today.  Vertigo I am referring you to Physical therapy rehab for your vertigo. I have also included information on the exercise you likely did before in [place] if you want to try this at home.  Tinnitus Please try using a fan or another white noise to help you rest at night. Tinnitus, or ringing in your ear is often related to hearing loss. I am referring you to a hearing test.  Blood pressure Please measure BP 1 time a day and bring number back in 4 weeks  I will call you with result of A1c, or test for diabetes.  I have ordered the following labs for you:  Lab Orders  No laboratory test(s) ordered today    Referrals ordered today:   Referral Orders  No referral(s) requested today     I have ordered the following medication/changed the following medications:   Stop the following medications: There are no discontinued medications.   Start the following medications: No orders of the defined types were placed in this encounter.    Follow up:  4 weeks    We look forward to seeing you next time. Please call our clinic at [phone] if you have any questions or concerns. The best time to call is Monday-Friday from 9am-4pm, but there is someone available 24/7. If after hours or the weekend, call the main hospital number and ask for the Internal Medicine Resident On-Call. If you need medication refills, please notify your pharmacy one week in advance and they will send [place] a request.   Thank you for trusting me with your care. Wishing you the best!   Christiana Fuchs, [place]

## 2022-07-26 NOTE — Progress Notes (Addendum)
Subjective:  CC: vertigo  HPI:  [name] is a 51 y.o. female with a past medical history stated below and presents today for vertigo. Please see problem based assessment and plan for additional details.  Past Medical History:  Diagnosis Date   Anemia    Dysrhythmia    Pregnancy induced hypertension     Current Outpatient Medications on File Prior to Visit  Medication Sig Dispense Refill   acetaminophen (TYLENOL) 500 MG tablet Take 500 mg by mouth every 6 (six) hours as needed for moderate pain.     ascorbic acid (VITAMIN C) 500 MG tablet Take 500 mg by mouth every other day.     Cholecalciferol (VITAMIN D) 50 MCG (2000 UT) tablet Take 4,000 Units by mouth every other day.     clobetasol (TEMOVATE) 0.05 % external solution Apply 1 application topically 2 (two) times daily. (Patient not taking: Reported on 08/15/2021) 100 mL 2   famotidine (PEPCID) 20 MG tablet Take 1 tablet (20 mg total) by mouth daily. 60 tablet 1   fluticasone (FLONASE) 50 MCG/ACT nasal spray Place 1 spray into both nostrils daily. (Patient taking differently: Place 1 spray into both nostrils daily as needed for allergies.) 11.1 mL 2   loratadine (CLARITIN) 10 MG tablet Take 10 mg by mouth daily as needed for allergies.     Multiple Vitamin (MULTIVITAMIN WITH MINERALS) TABS tablet Take 1 tablet by mouth daily.     vitamin B-12 (CYANOCOBALAMIN) 100 MCG tablet Take 100 mcg by mouth daily.     No current facility-administered medications on file prior to visit.    Family History  Problem Relation Age of Onset   Diabetes Maternal Grandmother    Anesthesia problems Neg Hx    Hypotension Neg Hx    Malignant hyperthermia Neg Hx    Pseudochol deficiency Neg Hx     Social History   Socioeconomic History   Marital status: Married    Spouse name: Not on file   Number of children: Not on file   Years of education: Not on file   Highest education level: Not on file  Occupational History   Not on file   Tobacco Use   Smoking status: Never   Smokeless tobacco: Never  Substance and Sexual Activity   Alcohol use: No   Drug use: No   Sexual activity: Yes    Birth control/protection: None, Condom  Other Topics Concern   Not on file  Social History Narrative   Originally from [place].   Social Determinants of Health   Financial Resource Strain: Not on file  Food Insecurity: Not on file  Transportation Needs: Not on file  Physical Activity: Not on file  Stress: Not on file  Social Connections: Not on file  Intimate Partner Violence: Not on file    Review of Systems: ROS negative except for what is noted on the assessment and plan.  Objective:   Vitals:   07/26/22 1336 07/26/22 1401  BP: (!) 145/92 (!) 155/96  Pulse: 72 77  Temp: 98.1 F (36.7 C)   TempSrc: Oral   SpO2: 100%   Weight: 169 lb 14.4 oz (77.1 kg)   Height: '5\' 3"'$  (1.6 m)     Physical Exam: Constitutional: well-appearing  Cardiovascular: regular rate and rhythm, no m/r/g Pulmonary/Chest: normal work of breathing on room air, lungs clear to auscultation bilaterally Abdominal: soft, non-tender, non-distended MSK: normal bulk and tone Neurological: alert & oriented x 3, moves extremities with  no deficits`   Assessment & Plan:  Benign positional vertigo Patient presents with continued symptoms of vertigo.  She was previously diagnosed with BPPV in [place] and was completing home exercises to help.  However over the last couple of months she has noticed increased vertigo.  This occurs whenever she moves her head quickly to one side of the other.  Vertigo is improved whenever she sits still for a few minutes and resolves completely.  Recently, she has had symptoms while driving which is more concerning. Dix-Hallpike maneuver completed and no nystagmus noted. A/P: Description of vertigo episode sound consistent with BPPV that she was previously diagnosed with. Nystagmus was not demonstrated on Dix-Hallpike today,  however I do not think that this excludes BPPV.  -Refer to vestibular rehab -Follow-up in 4 weeks  Cervical cancer screening Last Pap smear completed in 2019.  She was negative for HPV and cytology was also negative at that time.  She is due for repeat Pap in 2024.  Prediabetes Hemoglobin A1c increased from 5.7-6 at this office visit.  Talked with her about being at high risk for developing diabetes.  We discussed lifestyle modifications including increasing her activity level and limiting sugary foods. A/P: Repeat hemoglobin A1c in 07/2023  Elevated BP without diagnosis of hypertension Blood pressure was elevated today at 145/92. When rechecked remained elevated to 155/96.  She denies blurry vision, headache, chest pain, and shortness of breath.  Is not check her blood pressure at home but does have cuff there. A/P: This is an office visit with elevated blood pressure readings.  I talked with patient about monitoring blood pressure once daily over the next few weeks and bring log back in. -Follow-up in 4 weeks, if blood pressure consistently elevated she would be a good candidate for ARB.  Last BMP March/23 and creatinine was within normal limits.  Tinnitus Patient presents with symptoms of tinnitus since June of this year.  She has not had this problem before.  She describes a persistent ringing in ears bilaterally.  She notices this worse at night.  She denies noting hearing loss and has never worked with heavy equipment.  A/P: Differentials for tinnitus is broad. With her concurrent history of vertigo this is more concerning for meniere's disease. I will refer to audiometry testing to see if hearing loss is contributing to tinnitus.   If hearing is normal than would consider evaluation for iron deficiency. Last CBC 3/23 was wnl.   NAFLD (nonalcoholic fatty liver disease) Addendum 10/27: Patient with history of mild hepatic steatosis on RUQ [place] 4/22. CMP 2/22 with mild elevation in ALT,  repeat CMP normal 4/22. At visit patient asked lab was needed for annual exam. Fib 4 score low at 0.67. I talked with her about no guidelines for repeating lab annually at visit. She called back today and would prefer CMP to be rechecked. -CMP ordered -message sent to front desk to help patient with scheduling lab only visit next week.   Multiple thyroid nodules Addendum 10/27: History of multifocal goiter. Patient denies difficulty swallowing or compressive symptoms. She called back today to clarify about need for labs. She has follow-up with endocrine 11/7. We talked about following up with endocrine for further management of thyroid. She is ok with holding off on thyroid labs.   Patient discussed with Dr. Brent General Becky Cruz, D.O. [facility]  PGY-2 Pager: [phone]  Phone: [phone] Date 07/28/2022  Time 9:23 AM

## 2022-07-27 ENCOUNTER — Encounter: Payer: Self-pay | Admitting: Student

## 2022-07-27 DIAGNOSIS — H9319 Tinnitus, unspecified ear: Secondary | ICD-10-CM | POA: Insufficient documentation

## 2022-07-27 DIAGNOSIS — Z124 Encounter for screening for malignant neoplasm of cervix: Secondary | ICD-10-CM | POA: Insufficient documentation

## 2022-07-27 NOTE — Progress Notes (Signed)
Internal Medicine Clinic Attending  Case discussed with Dr. Masters  At the time of the visit.  We reviewed the resident's history and exam and pertinent patient test results.  I agree with the assessment, diagnosis, and plan of care documented in the resident's note.  

## 2022-07-27 NOTE — Assessment & Plan Note (Signed)
Patient presents with continued symptoms of vertigo.  She was previously diagnosed with BPPV in [place] and was completing home exercises to help.  However over the last couple of months she has noticed increased vertigo.  This occurs whenever she moves her head quickly to one side of the other.  Vertigo is improved whenever she sits still for a few minutes and resolves completely.  Recently, she has had symptoms while driving which is more concerning. Dix-Hallpike maneuver completed and no nystagmus noted. A/P: Description of vertigo episode sound consistent with BPPV that she was previously diagnosed with. Nystagmus was not demonstrated on Dix-Hallpike today, however I do not think that this excludes BPPV.  -Refer to vestibular rehab -Follow-up in 4 weeks

## 2022-07-27 NOTE — Assessment & Plan Note (Signed)
Patient presents with symptoms of tinnitus since June of this year.  She has not had this problem before.  She describes a persistent ringing in ears bilaterally.  She notices this worse at night.  She denies noting hearing loss and has never worked with heavy equipment.  A/P: Differentials for tinnitus is broad. With her concurrent history of vertigo this is more concerning for meniere's disease. I will refer to audiometry testing to see if hearing loss is contributing to tinnitus.   If hearing is normal than would consider evaluation for iron deficiency. Last CBC 3/23 was wnl.

## 2022-07-27 NOTE — Assessment & Plan Note (Signed)
Hemoglobin A1c increased from 5.7-6 at this office visit.  Talked with her about being at high risk for developing diabetes.  We discussed lifestyle modifications including increasing her activity level and limiting sugary foods. A/P: Repeat hemoglobin A1c in 07/2023

## 2022-07-27 NOTE — Assessment & Plan Note (Signed)
Last Pap smear completed in 2019.  She was negative for HPV and cytology was also negative at that time.  She is due for repeat Pap in 2024.

## 2022-07-27 NOTE — Assessment & Plan Note (Signed)
Blood pressure was elevated today at 145/92. When rechecked remained elevated to 155/96.  She denies blurry vision, headache, chest pain, and shortness of breath.  Is not check her blood pressure at home but does have cuff there. A/P: This is an office visit with elevated blood pressure readings.  I talked with patient about monitoring blood pressure once daily over the next few weeks and bring log back in. -Follow-up in 4 weeks, if blood pressure consistently elevated she would be a good candidate for ARB.  Last BMP March/23 and creatinine was within normal limits.

## 2022-07-28 DIAGNOSIS — K760 Fatty (change of) liver, not elsewhere classified: Secondary | ICD-10-CM | POA: Insufficient documentation

## 2022-07-28 NOTE — Assessment & Plan Note (Signed)
Addendum 10/27: Patient with history of mild hepatic steatosis on RUQ [place] 4/22. CMP 2/22 with mild elevation in ALT, repeat CMP normal 4/22. At visit patient asked lab was needed for annual exam. Fib 4 score low at 0.67. I talked with her about no guidelines for repeating lab annually at visit. She called back today and would prefer CMP to be rechecked. -CMP ordered -message sent to front desk to help patient with scheduling lab only visit next week.

## 2022-07-28 NOTE — Assessment & Plan Note (Signed)
Addendum 10/27: History of multifocal goiter. Patient denies difficulty swallowing or compressive symptoms. She called back today to clarify about need for labs. She has follow-up with endocrine 11/7. We talked about following up with endocrine for further management of thyroid. She is ok with holding off on thyroid labs.

## 2022-07-28 NOTE — Telephone Encounter (Signed)
Call returned to patient.  She has follow-up for her multinodular thyroid disease with endocrine 11/7.  They will be discussing if she is interested in surgical removal at that time.  He denies current compressive symptoms.  She has history of mild hepatic steatosis with fib 4 score of 0.67.  I talked with her about no guidelines for yearly screening.  She would prefer to have repeat CMP.  I have placed order for CMP and she will come in for lab only visit next week.

## 2022-07-28 NOTE — Addendum Note (Signed)
Addended by: Edwyna Perfect on: 07/28/2022 09:23 AM   Modules accepted: Orders

## 2022-07-31 NOTE — Addendum Note (Signed)
Addended by: Edwyna Perfect on: 07/31/2022 01:39 PM   Modules accepted: Orders

## 2022-07-31 NOTE — Addendum Note (Signed)
Addended by: Edwyna Perfect on: 07/31/2022 01:43 PM   Modules accepted: Orders

## 2022-08-07 NOTE — Progress Notes (Unsigned)
Name: Becky Cruz  MRN/ DOB: [id number], 02/09/1971    Age/ Sex: 51 y.o., female    PCP: Romana Juniper, MD   Reason for Endocrinology Evaluation: MNG     Date of Initial Endocrinology Evaluation: 01/04/2022    HPI: Ms. Becky Cruz is a 51 y.o. female with a past medical history of multinodular goiter, benign positional vertigo, GERD. The patient presented for initial endocrinology clinic visit on 01/04/2022  for consultative assistance with her MNG.   Patient has been diagnosed with multinodular goiter in 2019.  She is status post FNA of the left inferior 2.6 cm nodule with scant follicular epithelium (Bethesda category I).  Repeat FNA on 08/02/2021 again showed scant follicular epithelium.  A third attempt was made to biopsy the left inferior thyroid nodule on 08/17/2021 with a cytology report consistent with follicular lesion of undetermined significance (Bethesda category III) but Afirma came back as benign   Mother with thyroid disease   No prior exposure to radiation   Has 4 kids between the ages 11-22    SUBJECTIVE:    Today (08/08/22):  Becky Cruz is here for follow-up on multinodular goiter.  Weight has been stable  Today she has stable local neck swelling  Denies dysphagia  Denies constipation or diarrhea  Denies palpitations  Denies tremors  No biotin  She did feel tired last week after getting on the treadmill for 20 mins, she typically does not use the treadmill    HISTORY:  Past Medical History:  Past Medical History:  Diagnosis Date   Anemia    Dysrhythmia    Pregnancy induced hypertension    Past Surgical History:  Past Surgical History:  Procedure Laterality Date   WISDOM TOOTH EXTRACTION      Social History:  reports that she has never smoked. She has never used smokeless tobacco. She reports that she does not drink alcohol and does not use drugs. Family History: family history includes Diabetes in her maternal grandmother.   HOME  MEDICATIONS: Allergies as of 08/08/2022   No Known Allergies      Medication List        Accurate as of August 08, 2022 10:10 AM. If you have any questions, ask your nurse or doctor.          acetaminophen 500 MG tablet Commonly known as: TYLENOL Take 500 mg by mouth every 6 (six) hours as needed for moderate pain.   ascorbic acid 500 MG tablet Commonly known as: VITAMIN C Take 500 mg by mouth every other day.   clobetasol 0.05 % external solution Commonly known as: TEMOVATE Apply 1 application topically 2 (two) times daily.   famotidine 20 MG tablet Commonly known as: PEPCID Take 1 tablet (20 mg total) by mouth daily.   fluticasone 50 MCG/ACT nasal spray Commonly known as: FLONASE Place 1 spray into both nostrils daily. What changed:  when to take this reasons to take this   loratadine 10 MG tablet Commonly known as: CLARITIN Take 10 mg by mouth daily as needed for allergies.   multivitamin with minerals Tabs tablet Take 1 tablet by mouth daily.   vitamin B-12 100 MCG tablet Commonly known as: CYANOCOBALAMIN Take 100 mcg by mouth daily.   Vitamin D 50 MCG (2000 UT) tablet Take 4,000 Units by mouth every other day.          REVIEW OF SYSTEMS: A comprehensive ROS was conducted with the patient and is negative except  as per HPI    OBJECTIVE:  VS: LMP 11/25/2012 Comment: stopped breast feeding Nov2014   Wt Readings from Last 3 Encounters:  07/26/22 169 lb 14.4 oz (77.1 kg)  01/04/22 167 lb (75.8 kg)  12/13/21 168 lb 14.4 oz (76.6 kg)     EXAM: General: Pt appears well and is in NAD  Neck: General: Supple without adenopathy. Thyroid: Bilateral nodules  appreciated R>L . No thyroid bruit.  Lungs: Clear with good BS bilat with no rales, rhonchi, or wheezes  Heart: Auscultation: RRR.  Abdomen: Normoactive bowel sounds, soft, nontender, without masses or organomegaly palpable  Extremities:  BL LE: No pretibial edema normal ROM and strength.   Mental Status: Judgment, insight: Intact Orientation: Oriented to time, place, and person Mood and affect: No depression, anxiety, or agitation     DATA REVIEWED:  Latest Reference Range & Units 07/14/21 16:19  TSH 0.450 - 4.500 uIU/mL 0.537  Triiodothyronine (T3) 71 - 180 ng/dL 167  T4,Free(Direct) 0.82 - 1.77 ng/dL 1.14      Thyroid ultrasound 05/18/2021 Estimated total number of nodules >/= 1 cm: 6-10   Number of spongiform nodules >/=  2 cm not described below (TR1): 0   Number of mixed cystic and solid nodules >/= 1.5 cm not described below (TR2): 0   _________________________________________________________   Nodule #1: The previously biopsied nodule in the lateral aspect of the right mid gland remains essentially unchanged at 2.0 x 1.6 x 0.9 cm compared to 1.9 x 1.5 x 1.0 cm previously. The nodule remains hypoechoic, solid and mildly exophytic from the anterior aspect of the gland.   _________________________________________________________   Nodule #3 and #4: Small isoechoic solid nodules in the right mid and right inferior gland measuring no more than 1.4 cm in diameter. These both demonstrate slight interval regression compared to prior imaging and no longer meet criteria for further evaluation.   _________________________________________________________   Nodule # 5: Slowly enlarging mixed cystic and solid nodule occupying the majority of the left mid gland now measures 4.0 x 3.2 x 3.0 cm. This TI-RADS category 2 nodule remains low risk and does not warrant further evaluation.   _________________________________________________________   Nodule # 6:   Prior biopsy: No   Location: Left; Inferior   Maximum size: 2.6 cm; Other 2 dimensions: 2.5 x 1.9 cm, previously, 1.6 x 1.5 x 1.3 cm   Composition: solid/almost completely solid (2)   Echogenicity: isoechoic (1)   Shape: not taller-than-wide (0)   Margins: ill-defined (0)   Echogenic foci: none  (0)   ACR TI-RADS total points: 3.   ACR TI-RADS risk category:  TR3 (3 points).   Significant change in size (>/= 20% in two dimensions and minimal increase of 2 mm): Yes   Change in features: No   Change in ACR TI-RADS risk category: No   ACR TI-RADS recommendations:   **Given size (>/= 2.5 cm) and appearance, fine needle aspiration of this mildly suspicious nodule should be considered based on TI-RADS criteria.   _________________________________________________________   IMPRESSION: 1. No significant interval change in the size or appearance of the previously biopsied nodule in the right mid gland (labeled #1). Recommend correlation with prior biopsy results. 2. Interval enlargement of a TI-RADS category 3 nodule in the left inferior gland (labeled #6 above) which now meets criteria to consider fine-needle aspiration by segment. 3. Interval decrease in size of TI-RADS category 3 nodules in the right mid and lower gland (labeled #3 and #4) which no  longer meet criteria for further surveillance. 4. No new nodules.   FNA left inferior 08/17/2021 Clinical History: History of multinodular goiter, post US guided repeat  Bx of inferior left 2.6cm (TR3) thyroid nodule. (Note: pt has had two  previous non-Dx Bx's of this same nodule)  Specimen Submitted:  A. THYROID, LEFT, FINE NEEDLE ASPIRATION:    FINAL MICROSCOPIC DIAGNOSIS:  - Follicular lesion of undetermined significance (Bethesda category III)    Afirma Benign     ASSESSMENT/PLAN/RECOMMENDATIONS:   Multinodular goiter:   -Patient is clinically and biochemically euthyroid -No local neck symptoms -She is status post FNA of the left inferior 2.6 cm nodule, with atypia of undetermined significance but Afirma came back benign -The rest of her nodules have either remained stable or showed regression - Will repeat thyroid ultrasound this year  - Thyroid function normal   Follow-up 1 yr    Signed  electronically by: Mack Guise, MD  [facility]  [facility] [address] [place], [place] [address] Phone: [phone] FAX: [phone]   CC: Romana Juniper, MD [place] [address] Phone: [phone] Fax: [phone]   Return to Endocrinology clinic as below: Future Appointments  Date Time Provider [place]  08/08/2022  1:30 PM [facility] MM 2 [facility]  08/10/2022  8:00 AM Black, Ronie Spies, AUD [facility] None  08/17/2022  3:00 PM Girtha Rm, NP-C [facility] None  08/28/2022  1:45 PM Romana Juniper, MD [facility]

## 2022-08-08 ENCOUNTER — Ambulatory Visit
Admission: RE | Admit: 2022-08-08 | Discharge: 2022-08-08 | Disposition: A | Discharge status: Home / Self Care | Payer: Commercial Managed Care - HMO | Source: Ambulatory Visit | Attending: Emergency Medicine | Admitting: Emergency Medicine

## 2022-08-08 ENCOUNTER — Ambulatory Visit (INDEPENDENT_AMBULATORY_CARE_PROVIDER_SITE_OTHER): Payer: Commercial Managed Care - HMO | Admitting: Internal Medicine

## 2022-08-08 VITALS — BP 128/88 | HR 83 | Ht 63.0 in | Wt 169.4 lb

## 2022-08-08 DIAGNOSIS — E042 Nontoxic multinodular goiter: Secondary | ICD-10-CM

## 2022-08-08 DIAGNOSIS — Z1231 Encounter for screening mammogram for malignant neoplasm of breast: Secondary | ICD-10-CM

## 2022-08-08 LAB — T4, FREE: Free T4: 0.78 ng/dL (ref 0.60–1.60)

## 2022-08-08 LAB — TSH: TSH: 0.71 u[IU]/mL (ref 0.35–5.50)

## 2022-08-10 ENCOUNTER — Other Ambulatory Visit: Payer: Commercial Managed Care - HMO

## 2022-08-10 ENCOUNTER — Ambulatory Visit: Payer: Commercial Managed Care - HMO | Attending: Audiologist | Admitting: Audiologist

## 2022-08-10 DIAGNOSIS — H9313 Tinnitus, bilateral: Secondary | ICD-10-CM | POA: Insufficient documentation

## 2022-08-10 DIAGNOSIS — H9193 Unspecified hearing loss, bilateral: Secondary | ICD-10-CM | POA: Diagnosis present

## 2022-08-10 NOTE — Procedures (Signed)
Outpatient [facility]iology and [place], [place]  [id number] [phone]  AUDIOLOGICAL  EVALUATION  NAME: Becky Cruz     DOB:   [birth year]      MRN: [id number]                                                                                     DATE: 08/10/2022     REFERENT: Romana Juniper, MD STATUS: Outpatient DIAGNOSIS: Tinnitus, Decreased Hearing     History: [name] was seen for an audiological evaluation.  Patient seen today due to concerns for hissing sound in each ear.  She first began to experience the hissing sound after brief sudden exposure to loud microphone noise which caused sharp ringing and pain in both ears for 2 days.  After her ears stopped ringing she began to hear a sharp hissing sound in both ears that is now constant all day.  She is able to sleep and cope with the hissing by listening to music and podcasts when in quiet environments.  Patient has no concerns for her hearing.  She feels that she hears very well.  Recently she was diagnosed with TMJ and is grinding her teeth at night.  Patient has no warning signs for hearing loss.  Evaluation:  Otoscopy showed a clear view of the tympanic membranes, bilaterally Tympanometry results were consistent with normal middle ear function.  Audiometric testing was completed using conventional audiometry with supraural high frequency transducer. Speech Recognition Thresholds were 10dB in the right ear and 15dB in the left ear. Word Recognition was performed 40dB SL, scored 100% in the right ear and 100% in the left ear. Pure tone thresholds show normal hearing sloping after 4kHz to a mild sensorineural hearing loss in each ear.  Tinnitus was pitch and loudness matched to [facility] NBN at 26dB. Tinnitus rated a 7 on 1-10 scale with 10 a perfect match. Masking threshold using a '500Hz'$  NBN tone was 60dB.   Results:  The test results were reviewed with Alekhya. She has a very mild high pitched  hearing loss. The tinnitus is likely due to the sudden loud noise exposure. [name] was counseled on use of masking and how to protect hearing in the future. [name] was given a packet on information about tinnitus and how to use masking. [name] after counseling said she sleeps well, she has no difficulty ignoring the sound when listening to music or podcasts, and its not interfering with her day to day function.   Recommendations: No further audiologic testing is needed unless future hearing concerns arise. Use of Bose Sleep Buds or the MyNoise App is recommended to mask tinnitus when needed. Use of masker should be at night and when in quiet environments where the tinnitus is loud or when around triggering sound. Masker should be played at lowest level possible that provides relief from tinnitus. Earplugs only recommended when around continuous exposure to truly loud sound, such as a concert. Not recommended for every day use.    38 minutes spent testing and counseling on results.   [name]  Audiologist, Au.D., CCC-A 08/10/2022  9:22 AM  Cc: Romana Juniper, MD

## 2022-08-11 ENCOUNTER — Ambulatory Visit
Admission: RE | Admit: 2022-08-11 | Discharge: 2022-08-11 | Disposition: A | Discharge status: Home / Self Care | Payer: Commercial Managed Care - HMO | Source: Ambulatory Visit | Attending: Internal Medicine | Admitting: Internal Medicine

## 2022-08-11 DIAGNOSIS — E042 Nontoxic multinodular goiter: Secondary | ICD-10-CM

## 2022-08-14 ENCOUNTER — Telehealth: Payer: Self-pay | Admitting: Internal Medicine

## 2022-08-14 DIAGNOSIS — E042 Nontoxic multinodular goiter: Secondary | ICD-10-CM

## 2022-08-14 NOTE — Telephone Encounter (Signed)
Spoke to the patient on 08/14/2022 at 12:30 PM  Discussed thyroid ultrasound results, based on my calculations of nodules 1, 6, and 5   The patient has been noted with  a 20% increase in 2 dimensions of the right mid nodule #1 2.7 cm , hence meets FNA criteria, of note, this nodule has been biopsied in 2019 with benign cytology    Nodule #5 has increased from 4 cm to 4.7 cm in the longitudinal dimension, but less than 20% increase on the other 2 dimensions , which does not meet FNA criteria   Patient is interested in radiofrequency ablation, she will be referred to interventional radiology  An FNA order has been placed for the right mid 2.7 cm nodule

## 2022-08-17 ENCOUNTER — Encounter: Payer: Self-pay | Admitting: Family Medicine

## 2022-08-17 ENCOUNTER — Ambulatory Visit (INDEPENDENT_AMBULATORY_CARE_PROVIDER_SITE_OTHER): Payer: Commercial Managed Care - HMO | Admitting: Family Medicine

## 2022-08-17 VITALS — BP 120/82 | HR 86 | Temp 97.6°F | Ht 63.0 in | Wt 167.0 lb

## 2022-08-17 DIAGNOSIS — R7303 Prediabetes: Secondary | ICD-10-CM

## 2022-08-17 DIAGNOSIS — E042 Nontoxic multinodular goiter: Secondary | ICD-10-CM | POA: Diagnosis not present

## 2022-08-17 DIAGNOSIS — K219 Gastro-esophageal reflux disease without esophagitis: Secondary | ICD-10-CM

## 2022-08-17 DIAGNOSIS — R5382 Chronic fatigue, unspecified: Secondary | ICD-10-CM | POA: Diagnosis not present

## 2022-08-17 DIAGNOSIS — H8110 Benign paroxysmal vertigo, unspecified ear: Secondary | ICD-10-CM | POA: Diagnosis not present

## 2022-08-17 NOTE — Progress Notes (Signed)
New Patient Office Visit  Subjective    Patient ID: Becky Cruz, female    DOB: 1970/10/28  Age: 51 y.o. MRN: [id number]  CC:  Chief Complaint  Patient presents with   Establish Care    Dizziness and fatigue for years, recently has been happening more often and is effecting her day to day. Notes dizziness when she turns her head right or left.     HPI Becky Cruz presents to establish care  C/o extreme fatigue and dizziness. Fatigue had been an issue for years. She sees Dr. Kelton Pillar for multinodular thyroid disease, benign biopsies.   Hx of BPPV and notes dizziness with certain head positions. Previous PCP referred her to vestibular rehab.   Sleep study done in [place] 3 years ago  Depression has been an issue in the past.   LMP: 2014 early menopause.   Mammogram - last week.   Pap smear, mammogram and colonoscopy are up to date.    Married. 4 kids. Volunteers.   Denies fever, chills, chest pain, palpitations, shortness of breath, abdominal pain, N/V/D, urinary symptoms, LE edema.     Outpatient Encounter Medications as of 08/17/2022  Medication Sig   acetaminophen (TYLENOL) 500 MG tablet Take 500 mg by mouth every 6 (six) hours as needed for moderate pain.   ascorbic acid (VITAMIN C) 500 MG tablet Take 500 mg by mouth every other day.   Cholecalciferol (VITAMIN D) 50 MCG (2000 UT) tablet Take 4,000 Units by mouth every other day.   fluticasone (FLONASE) 50 MCG/ACT nasal spray Place 1 spray into both nostrils daily. (Patient taking differently: Place 1 spray into both nostrils daily as needed for allergies.)   loratadine (CLARITIN) 10 MG tablet Take 10 mg by mouth daily as needed for allergies.   Multiple Vitamin (MULTIVITAMIN WITH MINERALS) TABS tablet Take 1 tablet by mouth daily.   vitamin B-12 (CYANOCOBALAMIN) 100 MCG tablet Take 100 mcg by mouth daily.   [DISCONTINUED] clobetasol (TEMOVATE) 0.05 % external solution Apply 1 application topically 2 (two) times  daily. (Patient not taking: Reported on 08/15/2021)   [DISCONTINUED] famotidine (PEPCID) 20 MG tablet Take 1 tablet (20 mg total) by mouth daily.   No facility-administered encounter medications on file as of 08/17/2022.    Past Medical History:  Diagnosis Date   Anemia    Dysrhythmia    Pregnancy induced hypertension     Past Surgical History:  Procedure Laterality Date   WISDOM TOOTH EXTRACTION      Family History  Problem Relation Age of Onset   Diabetes Maternal Grandmother    Anesthesia problems Neg Hx    Hypotension Neg Hx    Malignant hyperthermia Neg Hx    Pseudochol deficiency Neg Hx     Social History   Socioeconomic History   Marital status: Married    Spouse name: Not on file   Number of children: Not on file   Years of education: Not on file   Highest education level: Not on file  Occupational History   Not on file  Tobacco Use   Smoking status: Never   Smokeless tobacco: Never  Substance and Sexual Activity   Alcohol use: No   Drug use: No   Sexual activity: Yes    Birth control/protection: None, Condom  Other Topics Concern   Not on file  Social History Narrative   Originally from [place].   Social Determinants of Health   Financial Resource Strain: Not on file  Food  Insecurity: Not on file  Transportation Needs: Not on file  Physical Activity: Not on file  Stress: Not on file  Social Connections: Not on file  Intimate Partner Violence: Not on file    ROS      Objective    BP 120/82 (BP Location: Left Arm, Patient Position: Sitting, Cuff Size: Large)   Pulse 86   Temp 97.6 F (36.4 C) (Temporal)   Ht '5\' 3"'$  (1.6 m)   Wt 167 lb (75.8 kg)   LMP 11/25/2012 Comment: stopped breast feeding Nov2014  SpO2 98%   BMI 29.58 kg/m   Physical Exam Constitutional:      General: She is not in acute distress.    Appearance: She is not ill-appearing.  Cardiovascular:     Rate and Rhythm: Normal rate and regular rhythm.  Pulmonary:      Effort: Pulmonary effort is normal.     Breath sounds: Normal breath sounds.  Musculoskeletal:        General: Normal range of motion.  Neurological:     General: No focal deficit present.     Mental Status: She is alert and oriented to person, place, and time.  Psychiatric:        Mood and Affect: Mood normal.        Behavior: Behavior normal.        Thought Content: Thought content normal.         Assessment & Plan:   Problem List Items Addressed This Visit       Digestive   Gastroesophageal reflux disease    Recommend lifestyle modifications and avoiding triggers. May take Pepcid prn. Follow up 4 wks.         Endocrine   Multiple thyroid nodules    Followed by Dr. Kelton Pillar. Reviewed office notes, labs and biopsy results.         Nervous and Auditory   Benign positional vertigo    Referral was made to vestibular rehab by previous PCP.         Other   Fatigue - Primary    Discussed possible etiologies. Reviewed labs. Follow up in 4 weeks.       Prediabetes    Reviewed labs. Recommend low sugar, low carbohydrate diet.        Return in about 4 weeks (around 09/14/2022).   Harland Dingwall, NP-C

## 2022-08-17 NOTE — Patient Instructions (Signed)
Thank you for trusting [place] with your health care.  Make sure you are staying well-hydrated.  Continue taking your vitamins.  Please call and schedule with an OB/GYN as discussed.  Follow-up in 2 to 4 weeks.  Obgyn Offices:   [facility] [address] [place], [place] [phone]  [facility] Address: [address] [place], [place] [address] Phone: [phone]  [facility] [address] [place], [place] [address] Phone: [phone]   [place], [place] [address] Phone: [phone]    Fatigue If you have fatigue, you feel tired all the time and have a lack of energy or a lack of motivation. Fatigue may make it difficult to start or complete tasks because of exhaustion. Occasional or mild fatigue is often a normal response to activity or life. However, long-term (chronic) or extreme fatigue may be a symptom of a medical condition such as: Depression. Not having enough red blood cells or hemoglobin in the blood (anemia). A problem with a small gland located in the lower front part of the neck (thyroid disorder). Rheumatologic conditions. These are problems related to the body's defense system (immune system). Infections, especially certain viral infections. Fatigue can also lead to negative health outcomes over time. Follow these instructions at home: Medicines Take over-the-counter and prescription medicines only as told by your health care provider. Take a multivitamin if told by your health care provider. Do not use herbal or dietary supplements unless they are approved by your health care provider. Eating and drinking  Avoid heavy meals in the evening. Eat a well-balanced diet, which includes lean proteins, whole grains, plenty of fruits and vegetables, and low-fat dairy products. Avoid eating or drinking too many products with caffeine  in them. Avoid alcohol. Drink enough fluid to keep your urine pale yellow. Activity  Exercise regularly, as told by your health care provider. Use or practice techniques to help you relax, such as yoga, tai chi, meditation, or massage therapy. Lifestyle Change situations that cause you stress. Try to keep your work and personal schedules in balance. Do not use recreational or illegal drugs. General instructions Monitor your fatigue for any changes. Go to bed and get up at the same time every day. Avoid fatigue by pacing yourself during the day and getting enough sleep at night. Maintain a healthy weight. Contact a health care provider if: Your fatigue does not get better. You have a fever. You suddenly lose or gain weight. You have headaches. You have trouble falling asleep or sleeping through the night. You feel angry, guilty, anxious, or sad. You have swelling in your legs or another part of your body. Get help right away if: You feel confused, feel like you might faint, or faint. Your vision is blurry or you have a severe headache. You have severe pain in your abdomen, your back, or the area between your waist and hips (pelvis). You have chest pain, shortness of breath, or an irregular or fast heartbeat. You are unable to urinate, or you urinate less than normal. You have abnormal bleeding from the rectum, nose, lungs, nipples, or, if you are female, the vagina. You vomit blood. You have thoughts about hurting yourself or others. These symptoms may be an emergency. Get help right away. Call 911. Do not wait to see if the symptoms will go away. Do not drive yourself to the hospital. Get help right away if you feel  like you may hurt yourself or others, or have thoughts about taking your own life. Go to your nearest emergency room or: Call 911. Call the [facility] at (703) [phone] or 988. This is open 24 hours a day. Text the Crisis Text Line at  972-409-3057. Summary If you have fatigue, you feel tired all the time and have a lack of energy or a lack of motivation. Fatigue may make it difficult to start or complete tasks because of exhaustion. Long-term (chronic) or extreme fatigue may be a symptom of a medical condition. Exercise regularly, as told by your health care provider. Change situations that cause you stress. Try to keep your work and personal schedules in balance. This information is not intended to replace advice given to you by your health care provider. Make sure you discuss any questions you have with your health care provider. Document Revised: 07/11/2021 Document Reviewed: 07/11/2021 Elsevier Patient Education  [place].

## 2022-08-25 NOTE — Assessment & Plan Note (Signed)
Discussed possible etiologies. Reviewed labs. Follow up in 4 weeks.

## 2022-08-25 NOTE — Assessment & Plan Note (Signed)
Reviewed labs. Recommend low sugar, low carbohydrate diet.

## 2022-08-25 NOTE — Assessment & Plan Note (Signed)
Recommend lifestyle modifications and avoiding triggers. May take Pepcid prn. Follow up 4 wks.

## 2022-08-25 NOTE — Assessment & Plan Note (Signed)
Referral was made to vestibular rehab by previous PCP.

## 2022-08-25 NOTE — Assessment & Plan Note (Signed)
Followed by Dr. Kelton Pillar. Reviewed office notes, labs and biopsy results.

## 2022-08-28 ENCOUNTER — Encounter: Payer: Commercial Managed Care - HMO | Admitting: Student

## 2022-09-12 ENCOUNTER — Ambulatory Visit
Admission: RE | Admit: 2022-09-12 | Discharge: 2022-09-12 | Disposition: A | Discharge status: Home / Self Care | Payer: Commercial Managed Care - HMO | Source: Ambulatory Visit | Attending: Internal Medicine | Admitting: Internal Medicine

## 2022-09-12 ENCOUNTER — Other Ambulatory Visit (HOSPITAL_COMMUNITY)
Admission: RE | Admit: 2022-09-12 | Discharge: 2022-09-12 | Disposition: A | Discharge status: Home / Self Care | Payer: Commercial Managed Care - HMO | Source: Ambulatory Visit | Attending: Internal Medicine | Admitting: Internal Medicine

## 2022-09-12 DIAGNOSIS — E042 Nontoxic multinodular goiter: Secondary | ICD-10-CM

## 2022-09-12 DIAGNOSIS — E041 Nontoxic single thyroid nodule: Secondary | ICD-10-CM | POA: Insufficient documentation

## 2022-09-13 ENCOUNTER — Ambulatory Visit (INDEPENDENT_AMBULATORY_CARE_PROVIDER_SITE_OTHER): Payer: Commercial Managed Care - HMO | Admitting: Family Medicine

## 2022-09-13 ENCOUNTER — Encounter: Payer: Self-pay | Admitting: Family Medicine

## 2022-09-13 VITALS — BP 136/84 | HR 74 | Temp 97.8°F | Ht 63.0 in | Wt 170.0 lb

## 2022-09-13 DIAGNOSIS — R319 Hematuria, unspecified: Secondary | ICD-10-CM

## 2022-09-13 DIAGNOSIS — R5383 Other fatigue: Secondary | ICD-10-CM

## 2022-09-13 DIAGNOSIS — R7303 Prediabetes: Secondary | ICD-10-CM | POA: Diagnosis not present

## 2022-09-13 DIAGNOSIS — E559 Vitamin D deficiency, unspecified: Secondary | ICD-10-CM | POA: Diagnosis not present

## 2022-09-13 DIAGNOSIS — M791 Myalgia, unspecified site: Secondary | ICD-10-CM

## 2022-09-13 DIAGNOSIS — K219 Gastro-esophageal reflux disease without esophagitis: Secondary | ICD-10-CM

## 2022-09-13 DIAGNOSIS — K760 Fatty (change of) liver, not elsewhere classified: Secondary | ICD-10-CM | POA: Diagnosis not present

## 2022-09-13 DIAGNOSIS — E538 Deficiency of other specified B group vitamins: Secondary | ICD-10-CM

## 2022-09-13 DIAGNOSIS — R7689 Other specified abnormal immunological findings in serum: Secondary | ICD-10-CM

## 2022-09-13 DIAGNOSIS — R768 Other specified abnormal immunological findings in serum: Secondary | ICD-10-CM

## 2022-09-13 LAB — HEPATIC FUNCTION PANEL
ALT: 29 U/L (ref 0–35)
AST: 18 U/L (ref 0–37)
Albumin: 4.4 g/dL (ref 3.5–5.2)
Alkaline Phosphatase: 72 U/L (ref 39–117)
Bilirubin, Direct: 0.2 mg/dL (ref 0.0–0.3)
Total Bilirubin: 0.6 mg/dL (ref 0.2–1.2)
Total Protein: 7.5 g/dL (ref 6.0–8.3)

## 2022-09-13 LAB — POCT URINALYSIS DIPSTICK
Bilirubin, UA: NEGATIVE
Blood, UA: POSITIVE
Glucose, UA: NEGATIVE
Ketones, UA: NEGATIVE
Leukocytes, UA: NEGATIVE
Nitrite, UA: NEGATIVE
Protein, UA: NEGATIVE
Spec Grav, UA: 1.01 (ref 1.010–1.025)
Urobilinogen, UA: 0.2 E.U./dL
pH, UA: 6 (ref 5.0–8.0)

## 2022-09-13 LAB — FOLATE: Folate: >23.8 ng/mL (ref >5.9)

## 2022-09-13 LAB — CBC WITH DIFFERENTIAL/PLATELET
Basophils Absolute: 0 10*3/uL (ref 0.0–0.1)
Basophils Relative: 0.5 % (ref 0.0–3.0)
Eosinophils Absolute: 0.3 10*3/uL (ref 0.0–0.7)
Eosinophils Relative: 5.8 % — ABNORMAL HIGH (ref 0.0–5.0)
HCT: 39.7 % (ref 36.0–46.0)
Hemoglobin: 13.2 g/dL (ref 12.0–15.0)
Lymphocytes Relative: 35.7 % (ref 12.0–46.0)
Lymphs Abs: 1.8 10*3/uL (ref 0.7–4.0)
MCHC: 33.2 g/dL (ref 30.0–36.0)
MCV: 88.5 fl (ref 78.0–100.0)
Monocytes Absolute: 0.4 10*3/uL (ref 0.1–1.0)
Monocytes Relative: 8 % (ref 3.0–12.0)
Neutro Abs: 2.5 10*3/uL (ref 1.4–7.7)
Neutrophils Relative %: 50 % (ref 43.0–77.0)
Platelets: 234 10*3/uL (ref 150.0–400.0)
RBC: 4.49 Mil/uL (ref 3.87–5.11)
RDW: 13.9 % (ref 11.5–15.5)
WBC: 4.9 10*3/uL (ref 4.0–10.5)

## 2022-09-13 LAB — URINALYSIS, MICROSCOPIC ONLY

## 2022-09-13 LAB — FERRITIN: Ferritin: 66.5 ng/mL (ref 10.0–291.0)

## 2022-09-13 LAB — CK: Total CK: 97 U/L (ref 7–177)

## 2022-09-13 LAB — VITAMIN D 25 HYDROXY (VIT D DEFICIENCY, FRACTURES): VITD: 36.45 ng/mL (ref 30.00–100.00)

## 2022-09-13 LAB — C-REACTIVE PROTEIN: CRP: <1 mg/dL (ref 0.5–20.0)

## 2022-09-13 LAB — CORTISOL: Cortisol, Plasma: 6.6 ug/dL

## 2022-09-13 LAB — SEDIMENTATION RATE: Sed Rate: 38 mm/hr — ABNORMAL HIGH (ref 0–30)

## 2022-09-13 LAB — VITAMIN B12: Vitamin B-12: 137 pg/mL — ABNORMAL LOW (ref 211–911)

## 2022-09-13 NOTE — Patient Instructions (Signed)
Please go downstairs for labs before you leave.   Try to get at least 1,200 calories during the day. Hydrate so that your urine is a light yellow.   We will be in touch with your results.

## 2022-09-13 NOTE — Progress Notes (Signed)
Subjective:     Patient ID: Becky Cruz, female    DOB: 26-Feb-1971, 51 y.o.   MRN: [id number]  Chief Complaint  Patient presents with   Follow-up    F/u for chronic fatigue and GERD, states GERD is a lot better now    HPI Patient is in today for follow up on GERD and fatigue. This is our second visit.  Taking Pepcid.   Fatigue has improved some with sleep and rest but not completely resolved.  Sleeping well 7-8 hours per night but non restorative sleep.  Sleep study done in [place] 3 years ago and OSA ruled out per patient.   She also has muscle aches.   Exercises in short bursts.   Eats yogurt or toast with tea in the morning, and a meal around 2 or 3 pm and then only water, tea or milk.  ?hydration   Fatty liver diagnosis   LMP: 2014 early menopause.   Pap smear, mammogram and colonoscopy are up to date.   Denies fever, chills, dizziness, chest pain, palpitations, shortness of breath, abdominal pain, N/V/D, urinary symptoms, LE edema.     Health Maintenance Due  Topic Date Due   COVID-19 Vaccine (1) Never done   DTaP/Tdap/Td (2 - Td or Tdap) 11/30/2020   Zoster Vaccines- Shingrix (1 of 2) Never done   INFLUENZA VACCINE  Never done    Past Medical History:  Diagnosis Date   ANA positive 09/17/2022   Anemia    Dysrhythmia    Pregnancy induced hypertension     Past Surgical History:  Procedure Laterality Date   WISDOM TOOTH EXTRACTION      Family History  Problem Relation Age of Onset   Diabetes Maternal Grandmother    Anesthesia problems Neg Hx    Hypotension Neg Hx    Malignant hyperthermia Neg Hx    Pseudochol deficiency Neg Hx     Social History   Socioeconomic History   Marital status: Married    Spouse name: Not on file   Number of children: Not on file   Years of education: Not on file   Highest education level: Not on file  Occupational History   Not on file  Tobacco Use   Smoking status: Never   Smokeless tobacco: Never   Substance and Sexual Activity   Alcohol use: No   Drug use: No   Sexual activity: Yes    Birth control/protection: None, Condom  Other Topics Concern   Not on file  Social History Narrative   Originally from [place].   Social Determinants of Health   Financial Resource Strain: Not on file  Food Insecurity: Not on file  Transportation Needs: Not on file  Physical Activity: Not on file  Stress: Not on file  Social Connections: Not on file  Intimate Partner Violence: Not on file    Outpatient Medications Prior to Visit  Medication Sig Dispense Refill   acetaminophen (TYLENOL) 500 MG tablet Take 500 mg by mouth every 6 (six) hours as needed for moderate pain.     ascorbic acid (VITAMIN C) 500 MG tablet Take 500 mg by mouth every other day.     Cholecalciferol (VITAMIN D) 50 MCG (2000 UT) tablet Take 4,000 Units by mouth every other day.     fluticasone (FLONASE) 50 MCG/ACT nasal spray Place 1 spray into both nostrils daily. (Patient taking differently: Place 1 spray into both nostrils daily as needed for allergies.) 11.1 mL 2   loratadine (  CLARITIN) 10 MG tablet Take 10 mg by mouth daily as needed for allergies.     Multiple Vitamin (MULTIVITAMIN WITH MINERALS) TABS tablet Take 1 tablet by mouth daily.     vitamin B-12 (CYANOCOBALAMIN) 100 MCG tablet Take 100 mcg by mouth daily.     No facility-administered medications prior to visit.    No Known Allergies  ROS     Objective:    Physical Exam Constitutional:      General: She is not in acute distress.    Appearance: She is not ill-appearing.  Eyes:     Extraocular Movements: Extraocular movements intact.     Conjunctiva/sclera: Conjunctivae normal.  Cardiovascular:     Rate and Rhythm: Normal rate and regular rhythm.  Pulmonary:     Effort: Pulmonary effort is normal.     Breath sounds: Normal breath sounds.  Musculoskeletal:     Cervical back: Normal range of motion and neck supple.  Skin:    General: Skin is warm  and dry.  Neurological:     General: No focal deficit present.     Mental Status: She is alert and oriented to person, place, and time.  Psychiatric:        Mood and Affect: Mood normal.        Behavior: Behavior normal.        Thought Content: Thought content normal.     BP 136/84 (BP Location: Left Arm, Patient Position: Sitting, Cuff Size: Large)   Pulse 74   Temp 97.8 F (36.6 C) (Temporal)   Ht '5\' 3"'$  (1.6 m)   Wt 170 lb (77.1 kg)   LMP 11/25/2012 Comment: stopped breast feeding Nov2014  SpO2 99%   BMI 30.11 kg/m  Wt Readings from Last 3 Encounters:  09/13/22 170 lb (77.1 kg)  08/17/22 167 lb (75.8 kg)  08/08/22 169 lb 6.4 oz (76.8 kg)       Assessment & Plan:   Problem List Items Addressed This Visit       Digestive   Gastroesophageal reflux disease   NAFLD (nonalcoholic fatty liver disease)   Relevant Orders   Hepatic function panel (Completed)     Other   ANA positive   B12 deficiency   Fatigue - Primary   Relevant Orders   POCT urinalysis dipstick (Completed)   ANA (Completed)   Sedimentation rate (Completed)   C-reactive protein (Completed)   Cyclic citrul peptide antibody, IgG (Completed)   Vitamin B12 (Completed)   Ferritin (Completed)   Folate (Completed)   CK (Creatine Kinase) (Completed)   Cortisol (Completed)   CBC with Differential/Platelet (Completed)   Myalgia   Relevant Orders   ANA (Completed)   Sedimentation rate (Completed)   C-reactive protein (Completed)   Cyclic citrul peptide antibody, IgG (Completed)   Vitamin B12 (Completed)   Folate (Completed)   CK (Creatine Kinase) (Completed)   Cortisol (Completed)   Prediabetes   Other Visit Diagnoses     Vitamin D deficiency       Relevant Orders   VITAMIN D 25 Hydroxy (Vit-D Deficiency, Fractures) (Completed)   Hematuria, unspecified type       Relevant Orders   Urine Microscopic (Completed)      Unexplained fatigue and myalgia. Check labs to look for underlying etiology.   Reviewed notes from endocrinologist and fatigue is not related to thyroid. She is taking vitamin D and B12.   UA shows trace RBCs but microscopy negative Counseling on not skipping meals and staying  hydrated.   GERD improving. Continue Pepcid prn.   ANA positive- referral to rheumatology.  B12 def- start B12 injections.     I am having Amil T. Vanpelt maintain her fluticasone, multivitamin with minerals, ascorbic acid, Vitamin D, acetaminophen, loratadine, and vitamin B-12.  No orders of the defined types were placed in this encounter.

## 2022-09-13 NOTE — Progress Notes (Signed)
Her vitamin B12 level is very low which may be causing her symptoms. I recommend B12 injections once weekly x 4 weeks and then follow up. Otherwise, her labs are ok so far.

## 2022-09-15 LAB — CYTOLOGY - NON PAP

## 2022-09-16 LAB — ANTI-NUCLEAR AB-TITER (ANA TITER)
ANA TITER: 1:40 {titer} — ABNORMAL HIGH
ANA Titer 1: 1:40 {titer} — ABNORMAL HIGH

## 2022-09-16 LAB — CYCLIC CITRUL PEPTIDE ANTIBODY, IGG: Cyclic Citrullin Peptide Ab: <16 UNITS

## 2022-09-16 LAB — ANA: Anti Nuclear Antibody (ANA): POSITIVE — AB

## 2022-09-17 ENCOUNTER — Encounter: Payer: Self-pay | Admitting: Family Medicine

## 2022-09-17 ENCOUNTER — Other Ambulatory Visit: Payer: Self-pay | Admitting: Family Medicine

## 2022-09-17 DIAGNOSIS — M791 Myalgia, unspecified site: Secondary | ICD-10-CM

## 2022-09-17 DIAGNOSIS — E538 Deficiency of other specified B group vitamins: Secondary | ICD-10-CM | POA: Insufficient documentation

## 2022-09-17 DIAGNOSIS — R5383 Other fatigue: Secondary | ICD-10-CM

## 2022-09-17 DIAGNOSIS — R768 Other specified abnormal immunological findings in serum: Secondary | ICD-10-CM

## 2022-09-17 HISTORY — DX: Other specified abnormal immunological findings in serum: R76.8

## 2022-09-17 NOTE — Progress Notes (Signed)
Please make sure she is aware of the findings and referral to rheumatology.  My note to her: Your lab test, ANA, is positive (low positive). This means you may have an autoimmune condition. This may or may not be an explanation for your recent symptoms. The next step is to refer you to a rheumatologist and I will do this right away. You should hear from their office in the next week or two. Please let me know if you do not. You should also get the B12 injections as previously recommended.

## 2022-09-18 ENCOUNTER — Ambulatory Visit (INDEPENDENT_AMBULATORY_CARE_PROVIDER_SITE_OTHER): Payer: Commercial Managed Care - HMO

## 2022-09-18 DIAGNOSIS — E538 Deficiency of other specified B group vitamins: Secondary | ICD-10-CM

## 2022-09-18 MED ORDER — CYANOCOBALAMIN 1000 MCG/ML IJ SOLN
1000.0000 ug | Freq: Once | INTRAMUSCULAR | Status: AC
  Administered 2022-09-18: 1000 ug via INTRAMUSCULAR

## 2022-09-18 NOTE — Progress Notes (Signed)
Pt got B12 injection on the right deltoid with no complication.

## 2022-09-26 ENCOUNTER — Ambulatory Visit (INDEPENDENT_AMBULATORY_CARE_PROVIDER_SITE_OTHER): Payer: Commercial Managed Care - HMO

## 2022-09-26 DIAGNOSIS — E538 Deficiency of other specified B group vitamins: Secondary | ICD-10-CM

## 2022-09-26 MED ORDER — CYANOCOBALAMIN 1000 MCG/ML IJ SOLN
1000.0000 ug | Freq: Once | INTRAMUSCULAR | Status: AC
  Administered 2022-09-26: 1000 ug via INTRAMUSCULAR

## 2022-09-26 NOTE — Patient Instructions (Addendum)
Pt here for monthly B12 injection per   B12 1028mg given IM, and pt tolerated injection well.  Next B12 injection scheduled for 10/04/2022 '@1'$ :00pm

## 2022-10-03 NOTE — Progress Notes (Signed)
Pt here for monthly B12 injection per   B12 1000mcg given IM, and pt tolerated injection well.   

## 2022-10-04 ENCOUNTER — Ambulatory Visit (INDEPENDENT_AMBULATORY_CARE_PROVIDER_SITE_OTHER): Payer: Self-pay | Admitting: *Deleted

## 2022-10-04 DIAGNOSIS — E538 Deficiency of other specified B group vitamins: Secondary | ICD-10-CM

## 2022-10-04 MED ORDER — CYANOCOBALAMIN 1000 MCG/ML IJ SOLN
1000.0000 ug | Freq: Once | INTRAMUSCULAR | Status: AC
  Administered 2022-10-04: 1000 ug via INTRAMUSCULAR

## 2022-10-04 NOTE — Progress Notes (Signed)
Pls cosign for B12 inj../lmb  

## 2022-10-11 ENCOUNTER — Ambulatory Visit: Payer: Self-pay

## 2022-10-30 NOTE — Progress Notes (Signed)
Office Visit Note  Patient: Becky Cruz             Date of Birth: October 29, 1970           MRN: [id number]             PCP: Girtha Rm, NP-C Referring: Girtha Rm, NP-C Visit Date: 11/10/2022 Occupation: @GUAROCC$ @  Subjective:  Positive ANA   History of Present Illness: Becky Cruz is a 52 y.o. female who presents today for new patient consultation as requested by her PCP.  According to the patient she has had longstanding fatigue which has warranted further workup.  According to the patient she was tested for several vitamin deficiencies as well as with a screening ANA test.  She states that she was found to be vitamin B12 deficient and has had vitamin B-12 injection which has improved her energy significantly.  She states she presents today to further discuss the positive ANA.  She denies any personal or family history of systemic lupus or autoimmune disease.  She states that she has some discomfort in her right hip when she is sitting crisscross but has no difficulty with ambulation or pain at night.  She denies any joint swelling.  She has not had any morning stiffness or nocturnal pain.  She denies any recent rashes, hair loss, sores in her mouth or nose, sicca symptoms, Raynaud's phenomenon, swollen lymph nodes, shortness of breath, pleuritic chest pain,  or palpitations. She denies any other new concerns or questions.   Activities of Daily Living:  Patient reports morning stiffness for 0 minutes.  Patient Denies nocturnal pain.  Difficulty dressing/grooming: Denies Difficulty climbing stairs: Denies Difficulty getting out of chair: Denies Difficulty using hands for taps, buttons, cutlery, and/or writing: Denies  Review of Systems  Constitutional:  Positive for fatigue.  HENT:  Negative for mouth sores and mouth dryness.   Eyes:  Negative for dryness.  Respiratory:  Negative for shortness of breath.   Cardiovascular:  Negative for chest pain and palpitations.   Gastrointestinal:  Negative for blood in stool, constipation and diarrhea.  Endocrine: Negative for increased urination.  Genitourinary:  Negative for involuntary urination.  Musculoskeletal:  Positive for joint pain and joint pain. Negative for gait problem, joint swelling, myalgias, muscle weakness, morning stiffness, muscle tenderness and myalgias.  Skin:  Negative for color change, rash, hair loss and sensitivity to sunlight.  Allergic/Immunologic: Negative for susceptible to infections.  Neurological:  Negative for dizziness and headaches.  Hematological:  Negative for swollen glands.  Psychiatric/Behavioral:  Negative for depressed mood and sleep disturbance. The patient is not nervous/anxious.     PMFS History:  Patient Active Problem List   Diagnosis Date Noted   ANA positive 09/17/2022   B12 deficiency 09/17/2022   Myalgia 09/17/2022   NAFLD (nonalcoholic fatty liver disease) 07/28/2022   Cervical cancer screening 07/27/2022   Tinnitus 07/27/2022   Dehydration 12/13/2021   Dysphagia 07/14/2021   Dysuria 07/14/2021   Back pain 07/14/2021   Healthcare maintenance 05/11/2021   Elevated alanine aminotransferase (ALT) level 11/25/2020   Benign positional vertigo 11/23/2020   Fatigue 07/01/2020   Alopecia 01/28/2020   Multiple thyroid nodules 12/08/2019   Elevated BP without diagnosis of hypertension 12/08/2019   Gastroesophageal reflux disease 03/15/2018   Prediabetes 02/11/2018   Secondary amenorrhea 02/06/2014    Past Medical History:  Diagnosis Date   ANA positive 09/17/2022   Anemia    Dysrhythmia    Pregnancy induced hypertension  Family History  Problem Relation Age of Onset   Diabetes Mother    Hypertension Mother    Hypertension Sister    Diabetes Sister    Diabetes Maternal Grandmother    Anesthesia problems Neg Hx    Hypotension Neg Hx    Malignant hyperthermia Neg Hx    Pseudochol deficiency Neg Hx    Past Surgical History:  Procedure  Laterality Date   WISDOM TOOTH EXTRACTION     Social History   Social History Narrative   Originally from [place].   Immunization History  Administered Date(s) Administered   Tdap 12/01/2010     Objective: Vital Signs: BP 133/86 (BP Location: Left Arm, Patient Position: Sitting, Cuff Size: Normal)   Pulse 100   Resp 15   Ht 5' 3"$  (1.6 m)   Wt 170 lb (77.1 kg)   LMP 11/25/2012 Comment: stopped breast feeding Nov2014  BMI 30.11 kg/m    Physical Exam Vitals and nursing note reviewed.  Constitutional:      Appearance: She is well-developed.  HENT:     Head: Normocephalic and atraumatic.     Mouth/Throat:     Comments: No parotid swelling or tenderness  Eyes:     Conjunctiva/sclera: Conjunctivae normal.  Cardiovascular:     Rate and Rhythm: Normal rate and regular rhythm.     Heart sounds: Normal heart sounds.  Pulmonary:     Effort: Pulmonary effort is normal.     Breath sounds: Normal breath sounds.  Abdominal:     General: Bowel sounds are normal.     Palpations: Abdomen is soft.  Musculoskeletal:     Cervical back: Normal range of motion.  Skin:    General: Skin is warm and dry.     Capillary Refill: Capillary refill takes less than 2 seconds.     Comments: No malar rash No digital ulcers or signs of gangrene.  No signs of sclerodactyly  No signs of alopecia.   Neurological:     Mental Status: She is alert and oriented to person, place, and time.  Psychiatric:        Behavior: Behavior normal.      Musculoskeletal Exam: C-spine, thoracic spine, and lumbar spine have good range of motion.  No midline spinal tenderness or SI joint tenderness.  Shoulder joints, elbow joints, wrist joints, MCPs, PIPs, DIPs have good range of motion with no synovitis.  Complete fist formation bilaterally.  Hip joints have good range of motion with no groin pain.  No tenderness over the trochanteric bursa at this time.  No tenderness along the IT band.  Both knee joints have good  range of motion with no warmth or effusion.  Ankle joints have good range of motion with no tenderness or joint swelling.  No tenderness or synovitis over MTP joints.  No evidence of Achilles tendinitis or plantar fasciitis.  CDAI Exam: CDAI Score: -- Patient Global: --; Provider Global: -- Swollen: --; Tender: -- Joint Exam 11/10/2022   No joint exam has been documented for this visit   There is currently no information documented on the homunculus. Go to the Rheumatology activity and complete the homunculus joint exam.  Investigation: No additional findings.  Imaging: No results found.  Recent Labs: Lab Results  Component Value Date   WBC 4.9 09/13/2022   HGB 13.2 09/13/2022   PLT 234.0 09/13/2022   NA 141 12/13/2021   K 3.9 12/13/2021   CL 103 12/13/2021   CO2 20 12/13/2021  GLUCOSE 98 12/13/2021   BUN 12 12/13/2021   CREATININE 0.75 12/13/2021   BILITOT 0.6 09/13/2022   ALKPHOS 72 09/13/2022   AST 18 09/13/2022   ALT 29 09/13/2022   PROT 7.5 09/13/2022   ALBUMIN 4.4 09/13/2022   CALCIUM 9.3 12/13/2021   GFRAA 104 11/23/2020    Speciality Comments: No specialty comments available.  Procedures:  No procedures performed Allergies: Patient has no known allergies.   Assessment / Plan:     Visit Diagnoses: Positive ANA (antinuclear antibody) - 09/13/22: ANA 1:40NS, 1:40NH, Vitamin D 36.45, Vitamin B12 137, Anti-CCP<16, CRP<1, ESR 38, CK WNL: Reviewed lab work from 09/13/2022 with the patient today in the office.  All questions were addressed.  Discussed that her ANA is a low positive nonspecific titer.  Discussed that the ESR is also borderline elevated but can be a nonspecific test.  The patient has no personal or family history of autoimmune disease.  She has had longstanding fatigue which has improved with vitamin B12 supplementation.  She has no other clinical features of systemic lupus or autoimmune disease at this time.  No synovitis was noted on examination  today.  No Malar rash noted.  No signs of alopecia.  No cervical lymphadenopathy or parotid swelling noted.  She has not had any symptoms of Raynaud's phenomenon.  No oral or nasal ulcerations.  She has not had any shortness of breath, pleuritic chest pain, or palpitations.  Her lungs were clear to auscultation today. Recommend obtaining the following lab work today for further evaluation.  Results will be discussed at her new patient follow-up visit.  - Plan: ANA, Anti-scleroderma antibody, RNP Antibody, Anti-Smith antibody, Sjogrens syndrome-A extractable nuclear antibody, Sjogrens syndrome-B extractable nuclear antibody, Anti-DNA antibody, double-stranded, C3 and C4, Rheumatoid factor, Sedimentation rate, CBC with Differential/Platelet, COMPLETE METABOLIC PANEL WITH GFR, Urinalysis, Routine w reflex microscopic  Elevated sed rate -ESR was 38 on 09/13/2022.  ESR will be rechecked today.  No synovitis was noted on examination today.  Plan: Rheumatoid factor, Sedimentation rate, COMPLETE METABOLIC PANEL WITH GFR  Other fatigue: Improved with vitamin B12 supplementation.   Myalgia: CK WNL on 09/13/22.  No muscular weakness.  She is not experiencing any myalgias or muscle tenderness at this time.  Other medical conditions are listed as follows:  History of gastroesophageal reflux (GERD)  NAFLD (nonalcoholic fatty liver disease)  Elevated alanine aminotransferase (ALT) level  Multiple thyroid nodules  B12 deficiency  Elevated BP without diagnosis of hypertension: Blood pressure is 133/86 today in the office.  Prediabetes    Orders: Orders Placed This Encounter  Procedures   ANA   Anti-scleroderma antibody   RNP Antibody   Anti-Smith antibody   Sjogrens syndrome-A extractable nuclear antibody   Sjogrens syndrome-B extractable nuclear antibody   Anti-DNA antibody, double-stranded   C3 and C4   Rheumatoid factor   Sedimentation rate   CBC with Differential/Platelet   COMPLETE  METABOLIC PANEL WITH GFR   Urinalysis, Routine w reflex microscopic   No orders of the defined types were placed in this encounter.    Follow-Up Instructions: Return for NPFU.   Ofilia Neas, PA-C  Note - This record has been created using Dragon software.  Chart creation errors have been sought, but may not always  have been located. Such creation errors do not reflect on  the standard of medical care.,

## 2022-11-10 ENCOUNTER — Encounter: Payer: Self-pay | Admitting: Physician Assistant

## 2022-11-10 ENCOUNTER — Ambulatory Visit: Payer: BLUE CROSS/BLUE SHIELD | Attending: Physician Assistant | Admitting: Physician Assistant

## 2022-11-10 VITALS — BP 133/86 | HR 100 | Resp 15 | Ht 63.0 in | Wt 170.0 lb

## 2022-11-10 DIAGNOSIS — R768 Other specified abnormal immunological findings in serum: Secondary | ICD-10-CM | POA: Diagnosis not present

## 2022-11-10 DIAGNOSIS — Z8719 Personal history of other diseases of the digestive system: Secondary | ICD-10-CM | POA: Diagnosis not present

## 2022-11-10 DIAGNOSIS — R700 Elevated erythrocyte sedimentation rate: Secondary | ICD-10-CM

## 2022-11-10 DIAGNOSIS — R7303 Prediabetes: Secondary | ICD-10-CM

## 2022-11-10 DIAGNOSIS — E042 Nontoxic multinodular goiter: Secondary | ICD-10-CM

## 2022-11-10 DIAGNOSIS — E538 Deficiency of other specified B group vitamins: Secondary | ICD-10-CM

## 2022-11-10 DIAGNOSIS — M791 Myalgia, unspecified site: Secondary | ICD-10-CM

## 2022-11-10 DIAGNOSIS — R7401 Elevation of levels of liver transaminase levels: Secondary | ICD-10-CM

## 2022-11-10 DIAGNOSIS — R5383 Other fatigue: Secondary | ICD-10-CM

## 2022-11-10 DIAGNOSIS — N911 Secondary amenorrhea: Secondary | ICD-10-CM

## 2022-11-10 DIAGNOSIS — R030 Elevated blood-pressure reading, without diagnosis of hypertension: Secondary | ICD-10-CM

## 2022-11-10 DIAGNOSIS — L659 Nonscarring hair loss, unspecified: Secondary | ICD-10-CM

## 2022-11-10 DIAGNOSIS — K760 Fatty (change of) liver, not elsewhere classified: Secondary | ICD-10-CM

## 2022-11-10 DIAGNOSIS — R1319 Other dysphagia: Secondary | ICD-10-CM

## 2022-11-13 LAB — CBC WITH DIFFERENTIAL/PLATELET
Absolute Monocytes: 393 {cells}/uL (ref 200–950)
Basophils Absolute: 51 {cells}/uL (ref 0–200)
Basophils Relative: 0.9 %
Eosinophils Absolute: 274 {cells}/uL (ref 15–500)
Eosinophils Relative: 4.8 %
HCT: 38.8 % (ref 35.0–45.0)
Hemoglobin: 13.2 g/dL (ref 11.7–15.5)
Lymphs Abs: 1442 {cells}/uL (ref 850–3900)
MCH: 30 pg (ref 27.0–33.0)
MCHC: 34 g/dL (ref 32.0–36.0)
MCV: 88.2 fL (ref 80.0–100.0)
MPV: 11 fL (ref 7.5–12.5)
Monocytes Relative: 6.9 %
Neutro Abs: 3540 {cells}/uL (ref 1500–7800)
Neutrophils Relative %: 62.1 %
Platelets: 265 Thousand/uL (ref 140–400)
RBC: 4.4 Million/uL (ref 3.80–5.10)
RDW: 12.3 % (ref 11.0–15.0)
Total Lymphocyte: 25.3 %
WBC: 5.7 Thousand/uL (ref 3.8–10.8)

## 2022-11-13 LAB — COMPLETE METABOLIC PANEL WITHOUT GFR
AG Ratio: 1.3 (calc) (ref 1.0–2.5)
ALT: 78 U/L — ABNORMAL HIGH (ref 6–29)
AST: 30 U/L (ref 10–35)
Albumin: 4.1 g/dL (ref 3.6–5.1)
Alkaline phosphatase (APISO): 90 U/L (ref 37–153)
BUN: 15 mg/dL (ref 7–25)
CO2: 22 mmol/L (ref 20–32)
Calcium: 9.2 mg/dL (ref 8.6–10.4)
Chloride: 108 mmol/L (ref 98–110)
Creat: 0.67 mg/dL (ref 0.50–1.03)
Globulin: 3.1 g/dL (ref 1.9–3.7)
Glucose, Bld: 118 mg/dL (ref 65–139)
Potassium: 3.9 mmol/L (ref 3.5–5.3)
Sodium: 140 mmol/L (ref 135–146)
Total Bilirubin: 0.4 mg/dL (ref 0.2–1.2)
Total Protein: 7.2 g/dL (ref 6.1–8.1)
eGFR: 106 mL/min/1.73m2

## 2022-11-13 LAB — URINALYSIS, ROUTINE W REFLEX MICROSCOPIC
Bacteria, UA: NONE SEEN /HPF
Bilirubin Urine: NEGATIVE
Glucose, UA: NEGATIVE
Hyaline Cast: NONE SEEN /LPF
Ketones, ur: NEGATIVE
Leukocytes,Ua: NEGATIVE
Nitrite: NEGATIVE
Protein, ur: NEGATIVE
RBC / HPF: NONE SEEN /HPF (ref 0–2)
Specific Gravity, Urine: 1.017 (ref 1.001–1.035)
Squamous Epithelial / HPF: NONE SEEN /HPF
WBC, UA: NONE SEEN /HPF (ref 0–5)
pH: <=5 (ref 5.0–8.0)

## 2022-11-13 LAB — RHEUMATOID FACTOR: Rheumatoid fact SerPl-aCnc: <14 IU/mL (ref <14)

## 2022-11-13 LAB — RNP ANTIBODY: Ribonucleic Protein(ENA) Antibody, IgG: <1 AI

## 2022-11-13 LAB — C3 AND C4
C3 Complement: 184 mg/dL (ref 83–193)
C4 Complement: 22 mg/dL (ref 15–57)

## 2022-11-13 LAB — SJOGRENS SYNDROME-B EXTRACTABLE NUCLEAR ANTIBODY: SSB (La) (ENA) Antibody, IgG: <1 AI

## 2022-11-13 LAB — ANTI-SMITH ANTIBODY: ENA SM Ab Ser-aCnc: <1 AI

## 2022-11-13 LAB — ANTI-DNA ANTIBODY, DOUBLE-STRANDED: ds DNA Ab: <1 IU/mL

## 2022-11-13 LAB — ANTI-SCLERODERMA ANTIBODY: Scleroderma (Scl-70) (ENA) Antibody, IgG: <1 AI

## 2022-11-13 LAB — SEDIMENTATION RATE: Sed Rate: 46 mm/h — ABNORMAL HIGH (ref 0–20)

## 2022-11-13 LAB — ANA: Anti Nuclear Antibody (ANA): NEGATIVE

## 2022-11-13 LAB — SJOGRENS SYNDROME-A EXTRACTABLE NUCLEAR ANTIBODY: SSA (Ro) (ENA) Antibody, IgG: <1 AI

## 2022-11-13 LAB — MICROSCOPIC MESSAGE

## 2022-11-13 NOTE — Progress Notes (Signed)
ESR remains elevated.  CBC within normal limits ALT is elevated-78.  AST within normal limits, rest of CMP within normal limits.  UA revealed 1+ hemoglobin.  Please clarify if the patient was on her menstrual cycle. ANA negative.  SCL 70 negative, RNP negative, Smith antibody negative, double-stranded 9 negative, complements within normal limits, Ro-, La-. And RF negative.    At the patient's initial office visit she was not experiencing any signs or symptoms of inflammatory arthritis.  Considering her ANA is negative there is very low suspicion for autoimmune disease at this time.  I would recommend following back up with PCP for further evaluation of the elevated sed rate in the future.  She can cancel her follow-up visit with [place] and follow-up as needed if she develops any increased joint pain or joint swelling.

## 2023-01-17 ENCOUNTER — Ambulatory Visit (INDEPENDENT_AMBULATORY_CARE_PROVIDER_SITE_OTHER): Payer: BLUE CROSS/BLUE SHIELD | Admitting: Family Medicine

## 2023-01-17 ENCOUNTER — Encounter: Payer: Self-pay | Admitting: Family Medicine

## 2023-01-17 VITALS — BP 126/84 | HR 75 | Temp 97.6°F | Ht 63.0 in | Wt 157.0 lb

## 2023-01-17 DIAGNOSIS — E538 Deficiency of other specified B group vitamins: Secondary | ICD-10-CM

## 2023-01-17 DIAGNOSIS — R7303 Prediabetes: Secondary | ICD-10-CM | POA: Diagnosis not present

## 2023-01-17 DIAGNOSIS — R319 Hematuria, unspecified: Secondary | ICD-10-CM | POA: Diagnosis not present

## 2023-01-17 DIAGNOSIS — K760 Fatty (change of) liver, not elsewhere classified: Secondary | ICD-10-CM | POA: Diagnosis not present

## 2023-01-17 DIAGNOSIS — R7401 Elevation of levels of liver transaminase levels: Secondary | ICD-10-CM | POA: Diagnosis not present

## 2023-01-17 DIAGNOSIS — R5383 Other fatigue: Secondary | ICD-10-CM

## 2023-01-17 LAB — URINALYSIS, ROUTINE W REFLEX MICROSCOPIC
Bilirubin Urine: NEGATIVE
Ketones, ur: NEGATIVE
Leukocytes,Ua: NEGATIVE
Nitrite: NEGATIVE
Specific Gravity, Urine: 1.025 (ref 1.000–1.030)
Total Protein, Urine: NEGATIVE
Urine Glucose: NEGATIVE
Urobilinogen, UA: 0.2 (ref 0.0–1.0)
pH: 6 (ref 5.0–8.0)

## 2023-01-17 LAB — HEPATIC FUNCTION PANEL
ALT: 25 U/L (ref 0–35)
AST: 20 U/L (ref 0–37)
Albumin: 4.3 g/dL (ref 3.5–5.2)
Alkaline Phosphatase: 60 U/L (ref 39–117)
Bilirubin, Direct: 0.1 mg/dL (ref 0.0–0.3)
Total Bilirubin: 0.5 mg/dL (ref 0.2–1.2)
Total Protein: 7.5 g/dL (ref 6.0–8.3)

## 2023-01-17 LAB — BASIC METABOLIC PANEL
BUN: 19 mg/dL (ref 6–23)
CO2: 26 mEq/L (ref 19–32)
Calcium: 9.4 mg/dL (ref 8.4–10.5)
Chloride: 105 mEq/L (ref 96–112)
Creatinine, Ser: 0.77 mg/dL (ref 0.40–1.20)
GFR: 89.58 mL/min (ref >60.00)
Glucose, Bld: 100 mg/dL — ABNORMAL HIGH (ref 70–99)
Potassium: 4.1 mEq/L (ref 3.5–5.1)
Sodium: 139 mEq/L (ref 135–145)

## 2023-01-17 LAB — TIQ- MISLABELED

## 2023-01-17 LAB — VITAMIN B12: Vitamin B-12: >1500 pg/mL — ABNORMAL HIGH (ref 211–911)

## 2023-01-17 LAB — HEMOGLOBIN A1C: Hgb A1c MFr Bld: 5.8 % (ref 4.6–6.5)

## 2023-01-17 MED ORDER — CYANOCOBALAMIN 1000 MCG/ML IJ SOLN
1000.0000 ug | Freq: Once | INTRAMUSCULAR | Status: AC
  Administered 2023-01-17: 1000 ug via INTRAMUSCULAR

## 2023-01-17 NOTE — Progress Notes (Signed)
Subjective:     Patient ID: Becky Cruz, female    DOB: Jan 03, 1971, 52 y.o.   MRN: [id number]  Chief Complaint  Patient presents with   Medical Management of Chronic Issues    F/u on B12, feeling a lot better. Missed her last dose and wanted to get another today. rheumatologist scheduled and they found her liver enzymes were high and wanted to discuss. On a low carb diet.  Would also like to check A1c    HPI Patient is in today for follow up on multiple conditions.  Reviewed recent note from rheumatologist and her ANA is now negative. No sign of autoimmune condition per work up.  Fatigue - improved. Today will be her 4th B12 injection.   Hx of fatty liver -mild. Elevated ALT at rheumatologist.  No alcohol or regular NSAID use.   She has lost 15 lbs by eating a low carb and low sugar diet.   Hematuria- needs eval  No menstural cycle in 5 years.   Health Maintenance Due  Topic Date Due   COVID-19 Vaccine (1) Never done   DTaP/Tdap/Td (2 - Td or Tdap) 11/30/2020   Zoster Vaccines- Shingrix (1 of 2) Never done   PAP SMEAR-Modifier  11/19/2022    Past Medical History:  Diagnosis Date   ANA positive 09/17/2022   Anemia    Dysrhythmia    Pregnancy induced hypertension     Past Surgical History:  Procedure Laterality Date   WISDOM TOOTH EXTRACTION      Family History  Problem Relation Age of Onset   Diabetes Mother    Hypertension Mother    Hypertension Sister    Diabetes Sister    Diabetes Maternal Grandmother    Anesthesia problems Neg Hx    Hypotension Neg Hx    Malignant hyperthermia Neg Hx    Pseudochol deficiency Neg Hx     Social History   Socioeconomic History   Marital status: Married    Spouse name: Not on file   Number of children: Not on file   Years of education: Not on file   Highest education level: Not on file  Occupational History   Not on file  Tobacco Use   Smoking status: Never    Passive exposure: Never   Smokeless tobacco:  Never  Vaping Use   Vaping Use: Never used  Substance and Sexual Activity   Alcohol use: No   Drug use: No   Sexual activity: Yes    Birth control/protection: None, Condom  Other Topics Concern   Not on file  Social History Narrative   Originally from [place].   Social Determinants of Health   Financial Resource Strain: Not on file  Food Insecurity: Not on file  Transportation Needs: Not on file  Physical Activity: Not on file  Stress: Not on file  Social Connections: Not on file  Intimate Partner Violence: Not on file    Outpatient Medications Prior to Visit  Medication Sig Dispense Refill   acetaminophen (TYLENOL) 500 MG tablet Take 500 mg by mouth every 6 (six) hours as needed for moderate pain.     ascorbic acid (VITAMIN C) 500 MG tablet Take 500 mg by mouth every other day.     Cholecalciferol (VITAMIN D) 50 MCG (2000 UT) tablet Take 4,000 Units by mouth every other day.     fluticasone (FLONASE) 50 MCG/ACT nasal spray Place 1 spray into both nostrils daily. (Patient taking differently: Place 1 spray into both  nostrils daily as needed for allergies.) 11.1 mL 2   loratadine (CLARITIN) 10 MG tablet Take 10 mg by mouth daily as needed for allergies.     Multiple Vitamin (MULTIVITAMIN WITH MINERALS) TABS tablet Take 1 tablet by mouth daily.     Omega-3 Fatty Acids (OMEGA 3 PO) Take by mouth.     vitamin B-12 (CYANOCOBALAMIN) 100 MCG tablet Take 100 mcg by mouth daily. (Patient not taking: Reported on 01/17/2023)     No facility-administered medications prior to visit.    No Known Allergies  Review of Systems  Constitutional:  Positive for weight loss. Negative for chills, fever and malaise/fatigue.  Respiratory:  Negative for shortness of breath.   Cardiovascular:  Negative for chest pain, palpitations and leg swelling.  Gastrointestinal:  Negative for abdominal pain, constipation, diarrhea, nausea and vomiting.  Genitourinary:  Negative for dysuria, flank pain, frequency  and urgency.  Musculoskeletal:  Negative for joint pain and myalgias.  Neurological:  Negative for dizziness and weakness.  Psychiatric/Behavioral:  Negative for depression. The patient is not nervous/anxious.        Objective:    Physical Exam Constitutional:      General: She is not in acute distress.    Appearance: She is not ill-appearing.  Eyes:     Extraocular Movements: Extraocular movements intact.     Conjunctiva/sclera: Conjunctivae normal.  Cardiovascular:     Rate and Rhythm: Normal rate.  Pulmonary:     Effort: Pulmonary effort is normal.  Musculoskeletal:     Cervical back: Normal range of motion and neck supple.  Skin:    General: Skin is warm and dry.  Neurological:     General: No focal deficit present.     Mental Status: She is alert and oriented to person, place, and time.  Psychiatric:        Mood and Affect: Mood normal.        Behavior: Behavior normal.        Thought Content: Thought content normal.     BP 126/84 (BP Location: Left Arm, Patient Position: Sitting, Cuff Size: Large)   Pulse 75   Temp 97.6 F (36.4 C) (Temporal)   Ht 5\' 3"  (1.6 m)   Wt 157 lb (71.2 kg)   LMP 11/25/2012 Comment: stopped breast feeding Nov2014  SpO2 99%   BMI 27.81 kg/m  Wt Readings from Last 3 Encounters:  01/17/23 157 lb (71.2 kg)  11/10/22 170 lb (77.1 kg)  09/13/22 170 lb (77.1 kg)       Assessment & Plan:   Problem List Items Addressed This Visit       Digestive   NAFLD (nonalcoholic fatty liver disease)    Reviewed results from ultrasound in 2022 showing mild hepatic steatosis.  Elevated ALT at rheumatology appointment.  We will recheck liver function tests today.  Congratulated her on weight loss and encouraged her to keep up the good work.  Discussed that weight loss is the best treatment for fatty liver disease.      Relevant Orders   Hepatic function panel   Amb ref to Medical Nutrition Therapy-MNT     Other   B12 deficiency    4th B12  injection given in office today. Reports significant improvement in energy level. Recommend starting on OTC vitamin B12 1,000 mcg daily.  Recheck B12 level and follow up.       Relevant Orders   Vitamin B12   Elevated alanine aminotransferase (ALT) level - Primary  Recheck liver function testing.  History of fatty liver disease      Relevant Orders   Hemoglobin A1c   Basic metabolic panel   Hepatic function panel   Fatigue    Energy level as improved significantly with B12 replacement and low sugar, low carbohydrate diet.       Hematuria    Red blood cells seen on multiple occasions. No menstrual cycle in several years. Check urinalysis with reflex to microscopic and urine culture today.  If she continues having hematuria then I will refer her to urology for further evaluation.        Relevant Orders   Urinalysis, Routine w reflex microscopic   Urine Culture   Prediabetes    Recheck A1c. She is doing well on low sugar, low carbohydrate diet. Currently fasting. Requests referral to nutritionist to help with making sure she is eating enough carbohydrates, vitamins and minerals. She has been deficient in B12 which we are replacing. Taking a MVI.       Relevant Orders   Hemoglobin A1c   Basic metabolic panel   Amb ref to Medical Nutrition Therapy-MNT   Reviewed recent note from rheumatologist and her ANA is now negative. No sign of autoimmune condition per work up.   I am having Becky Cruz maintain her fluticasone, multivitamin with minerals, ascorbic acid, Vitamin D, acetaminophen, loratadine, vitamin B-12, and Omega-3 Fatty Acids (OMEGA 3 PO). We administered cyanocobalamin.  Meds ordered this encounter  Medications   cyanocobalamin (VITAMIN B12) injection 1,000 mcg

## 2023-01-17 NOTE — Assessment & Plan Note (Signed)
Reviewed results from ultrasound in 2022 showing mild hepatic steatosis.  Elevated ALT at rheumatology appointment.  We will recheck liver function tests today.  Congratulated her on weight loss and encouraged her to keep up the good work.  Discussed that weight loss is the best treatment for fatty liver disease.

## 2023-01-17 NOTE — Assessment & Plan Note (Signed)
Red blood cells seen on multiple occasions. No menstrual cycle in several years. Check urinalysis with reflex to microscopic and urine culture today.  If she continues having hematuria then I will refer her to urology for further evaluation.

## 2023-01-17 NOTE — Patient Instructions (Signed)
Keep up the good work.   Start taking over the counter vitamin B12 1,000 mcg daily. Your multivitamin also has B12 in it.   You should hear from the nutritionist, let us know if you do not.   We will be in touch with your lab results.

## 2023-01-17 NOTE — Assessment & Plan Note (Signed)
Energy level as improved significantly with B12 replacement and low sugar, low carbohydrate diet.

## 2023-01-17 NOTE — Progress Notes (Signed)
Patient received B12 injection and tolerated it well.

## 2023-01-17 NOTE — Assessment & Plan Note (Signed)
Recheck liver function testing.  History of fatty liver disease

## 2023-01-17 NOTE — Assessment & Plan Note (Signed)
Recheck A1c. She is doing well on low sugar, low carbohydrate diet. Currently fasting. Requests referral to nutritionist to help with making sure she is eating enough carbohydrates, vitamins and minerals. She has been deficient in B12 which we are replacing. Taking a MVI.

## 2023-01-17 NOTE — Assessment & Plan Note (Signed)
4th B12 injection given in office today. Reports significant improvement in energy level. Recommend starting on OTC vitamin B12 1,000 mcg daily.  Recheck B12 level and follow up.

## 2023-01-18 LAB — TIQ- MISLABELED: Test Ordered On Req: 395

## 2023-01-18 NOTE — Addendum Note (Signed)
Addended by: Marinus Maw on: 01/18/2023 08:59 AM   Modules accepted: Orders

## 2023-01-24 LAB — MISLABELED CANCEL: Test Affected: 395

## 2023-02-16 IMAGING — US US FNA BIOPSY THYROID 1ST LESION
1 series · 13 of 17 positions shown · non-contrast
Comparison: Thyroid US on 05/19/2021

MEDICATIONS:
3 ML 1 % lidocaine

COMPLICATIONS:
None immediate.

INDICATION: Indeterminate thyroid nodule in the left inferior lobe.

EXAM:
ULTRASOUND GUIDED FINE NEEDLE ASPIRATION OF INDETERMINATE THYROID
NODULE
TECHNIQUE: Informed written consent was obtained from the patient after a
discussion of the risks, benefits and alternatives to treatment.
Questions regarding the procedure were encouraged and answered. A
timeout was performed prior to the initiation of the procedure.

[Series 1: us fna biopsy thyroid 1st lesion · 0.07mm/px · 17 acquisitions, 13 frames shown]
[im 1/17]
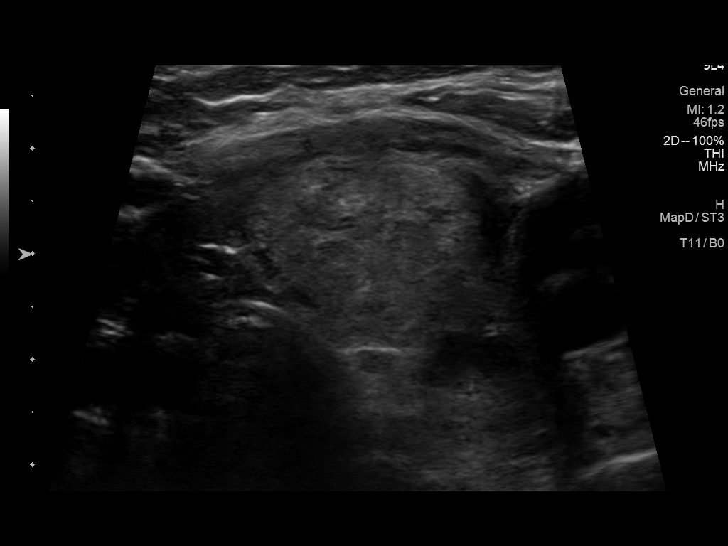
[im 2/17]
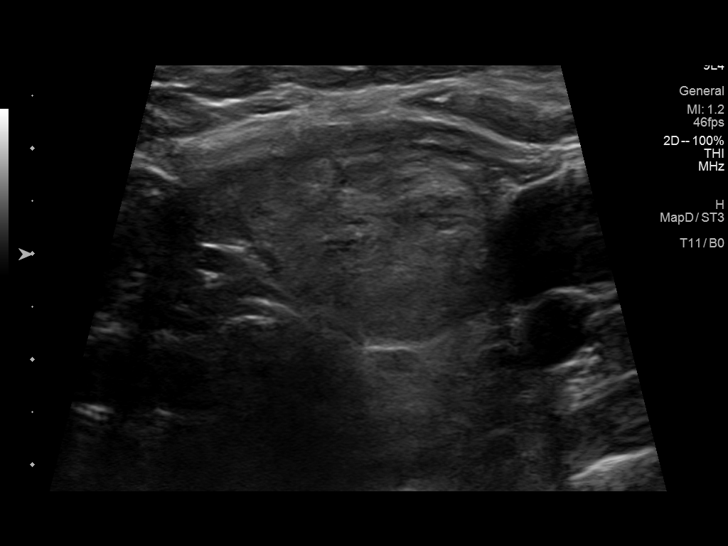
[im 4/17]
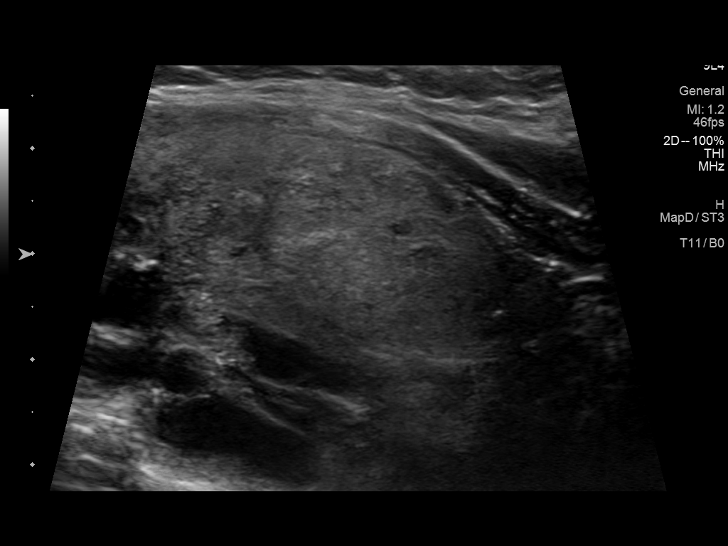
[im 5/17]
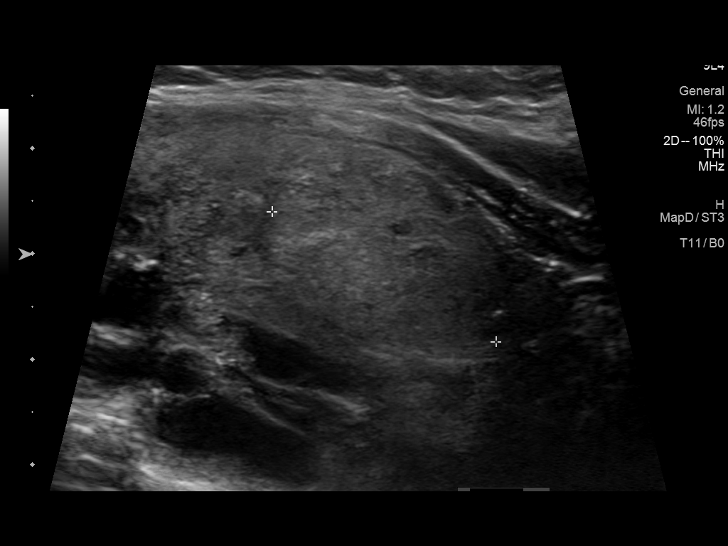
[im 6/17]
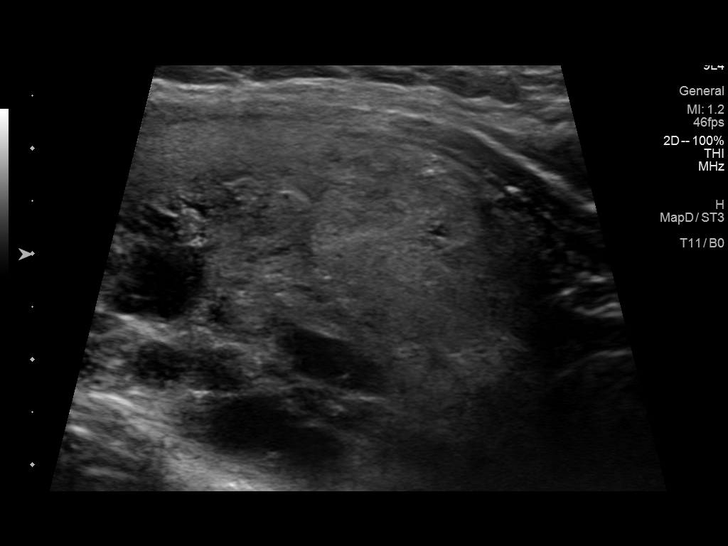
[im 8/17]
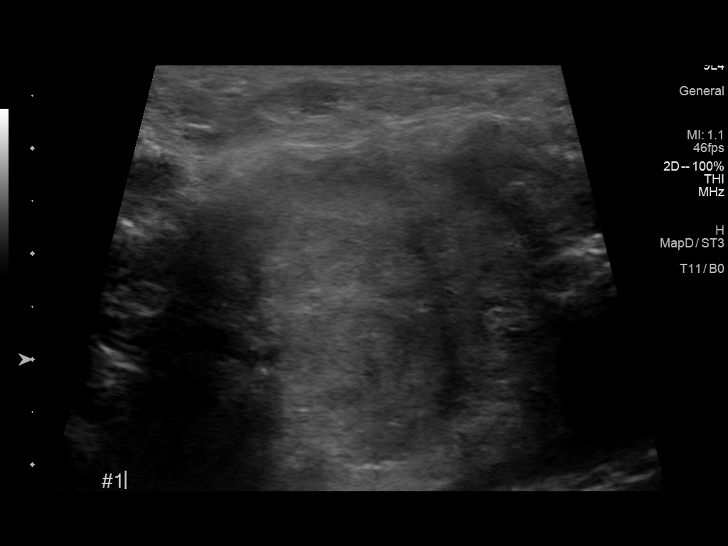
[im 9/17]
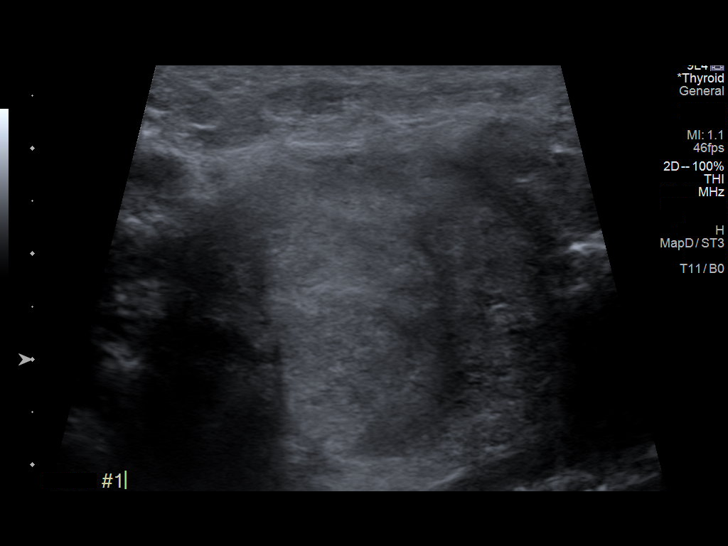
[im 10/17]
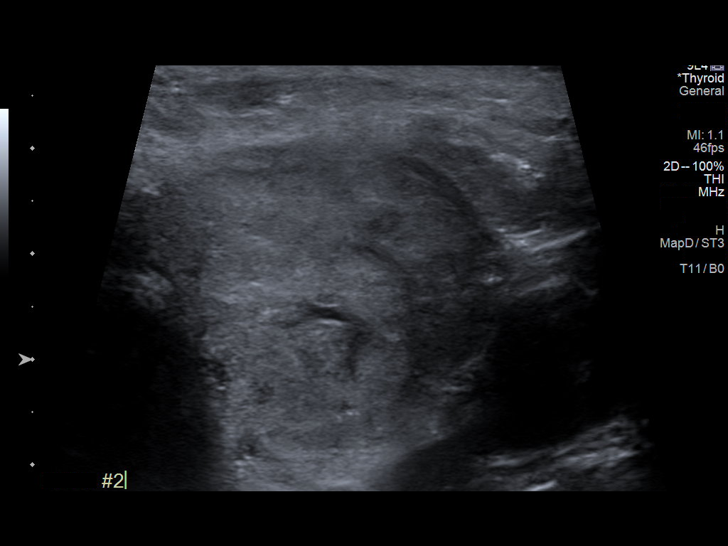
[im 12/17]
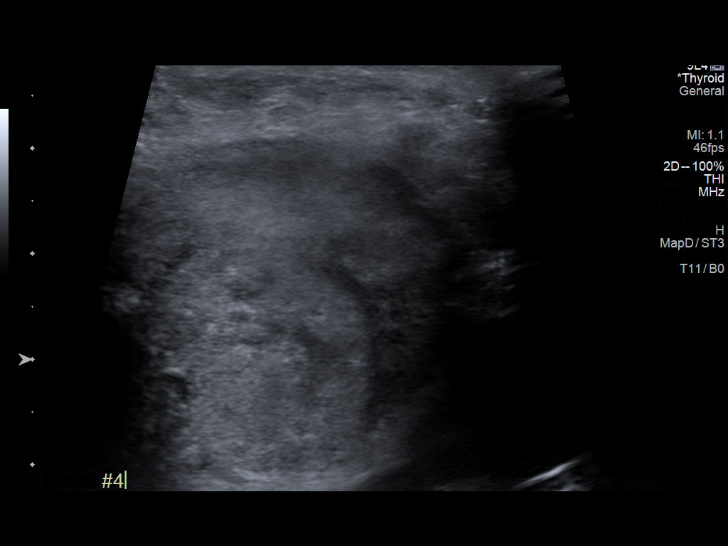
[im 13/17]
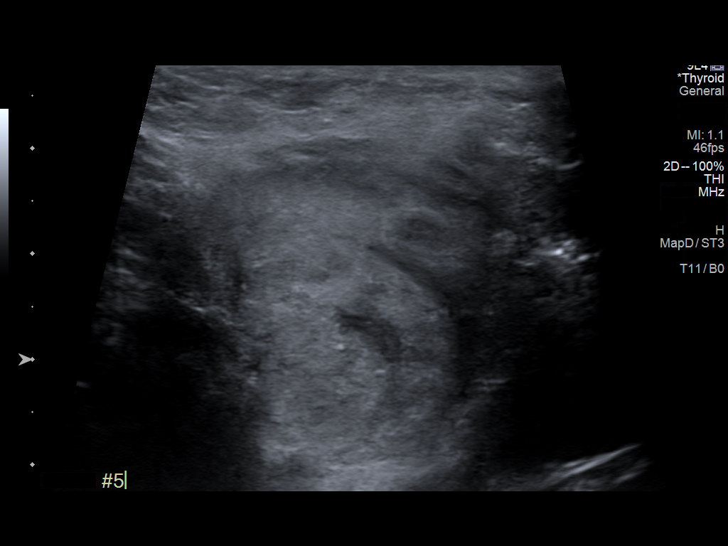
[im 14/17]
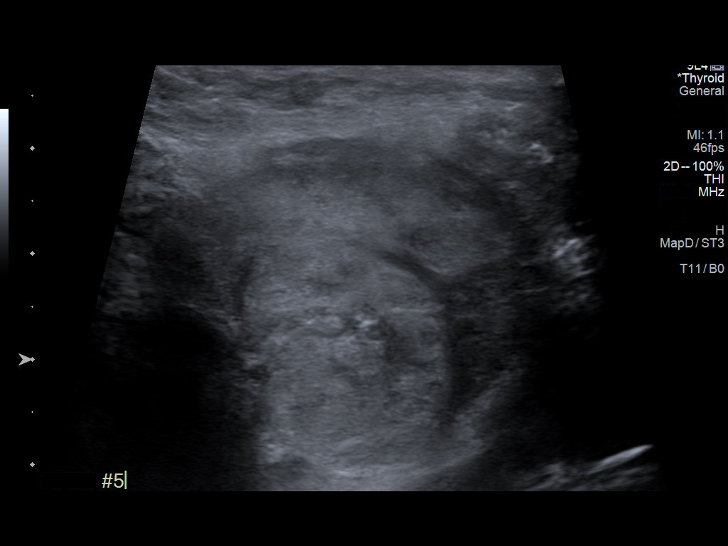
[im 16/17]
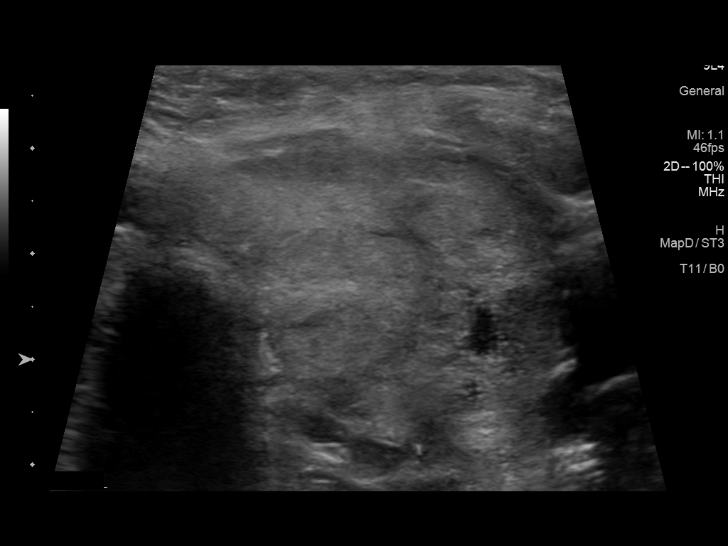
[im 17/17]
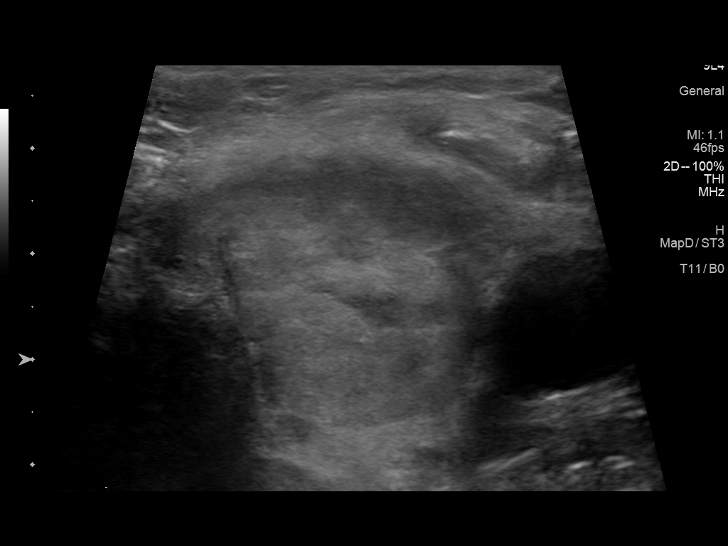

[13 of 17 positions shown; findings below may reference images not displayed]

Pre-procedural ultrasound scanning demonstrated unchanged size and
appearance of the indeterminate nodule within the left inferior
thyroid lobe.

The procedure was planned. The neck was prepped in the usual sterile
fashion, and a sterile drape was applied covering the operative
field. A timeout was performed prior to the initiation of the
procedure. Local anesthesia was provided with 1% lidocaine.

Under direct ultrasound guidance, 5 FNA biopsies were performed of
the left inferior thyroid nodule with a 27 gauge needle. Two of
these samples were obtained for AFIRMA. Multiple ultrasound images
were saved for procedural documentation purposes. The samples were
prepared and submitted to pathology.

Limited post procedural scanning was negative for hematoma or
additional complication. Dressings were placed. The patient
tolerated the above procedures procedure well without immediate
postprocedural complication.
FINDINGS: Nodule reference number based on prior diagnostic ultrasound: 6

Maximum size: 2.6 cm

Location: Left; Inferior

ACR TI-RADS risk category: TR3 (3 points)

Reason for biopsy: meets ACR TI-RADS criteria

Ultrasound imaging confirms appropriate placement of the needles
within the thyroid nodule.
IMPRESSION: Technically successful ultrasound guided fine needle aspiration of
the left inferior thyroid nodule.

## 2023-04-11 IMAGING — US US FNA BIOPSY THYROID 1ST LESION
1 series · 12 of 12 positions shown · non-contrast
Comparison: US Thyroid 05/18/21

INDICATION: Repeat biopsy

Left inferior thyroid nodule
2.6 cm
Previous biopsy same nodule 06/09/21:
FINAL MICROSCOPIC DIAGNOSIS:
- Scant follicular epithelium present ([REDACTED] category I)
EXAM:
ULTRASOUND GUIDED FINE NEEDLE ASPIRATION OF INDETERMINATE THYROID
NODULE
TECHNIQUE: Informed written consent was obtained from the patient after a
discussion of the risks, benefits and alternatives to treatment.
Questions regarding the procedure were encouraged and answered. A
timeout was performed prior to the initiation of the procedure.

[Series 1: us fna biopsy thyroid 1st lesion · 0.06mm/px · 12 acquisitions, 12 frames shown]
[im 1/12]
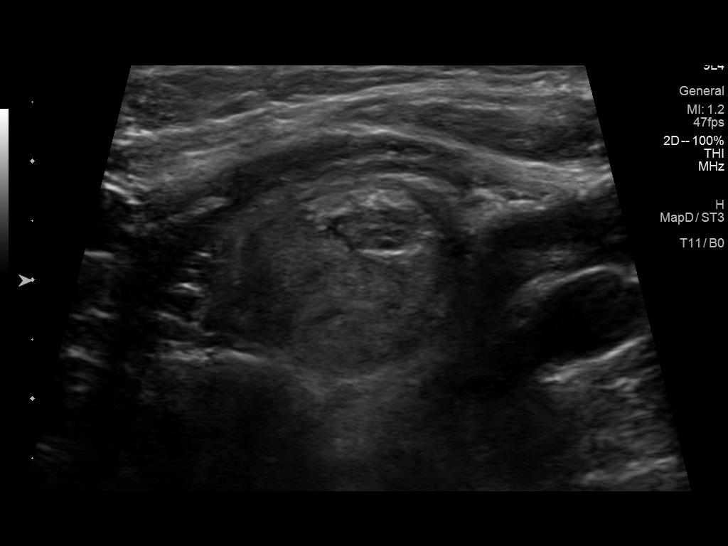
[im 2/12]
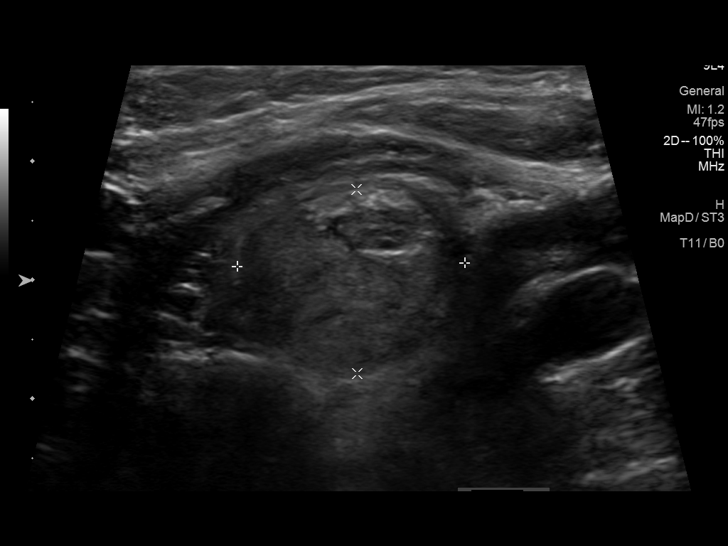
[im 3/12]
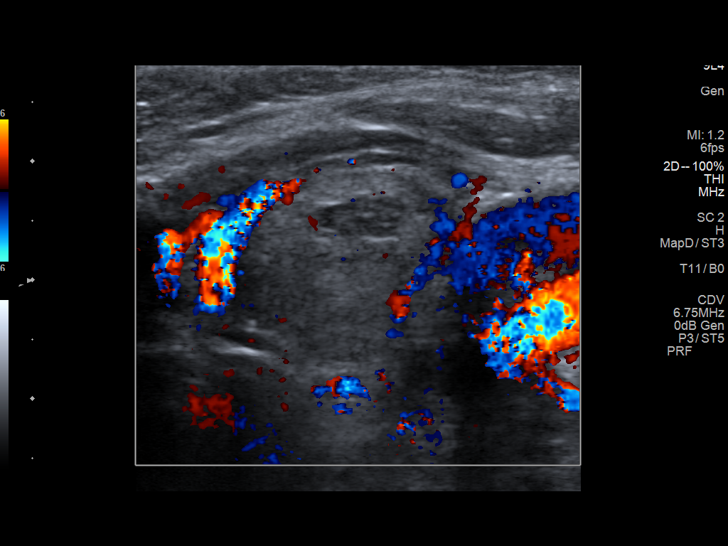
[im 4/12]
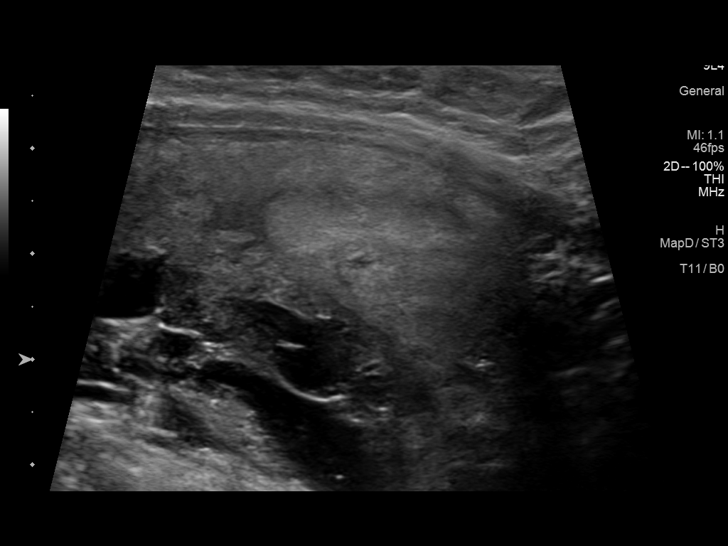
[im 5/12]
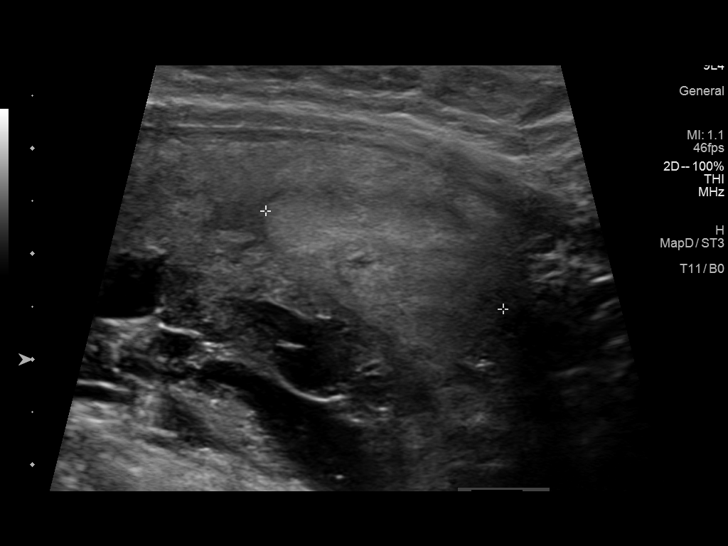
[im 6/12]
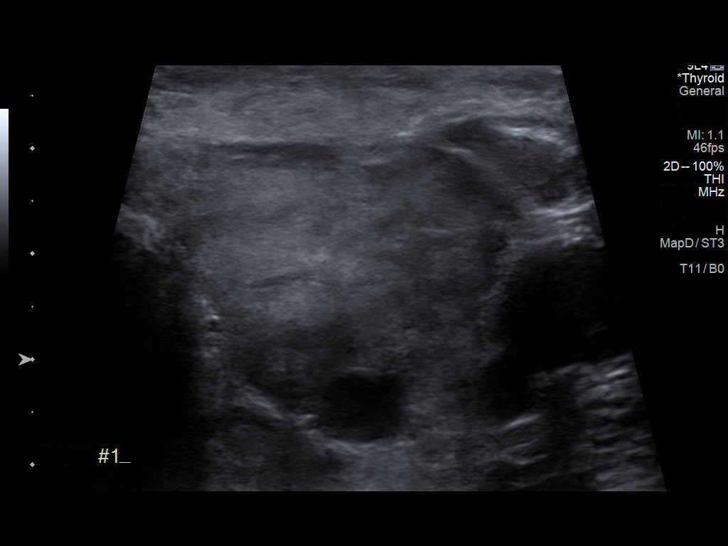
[im 7/12]
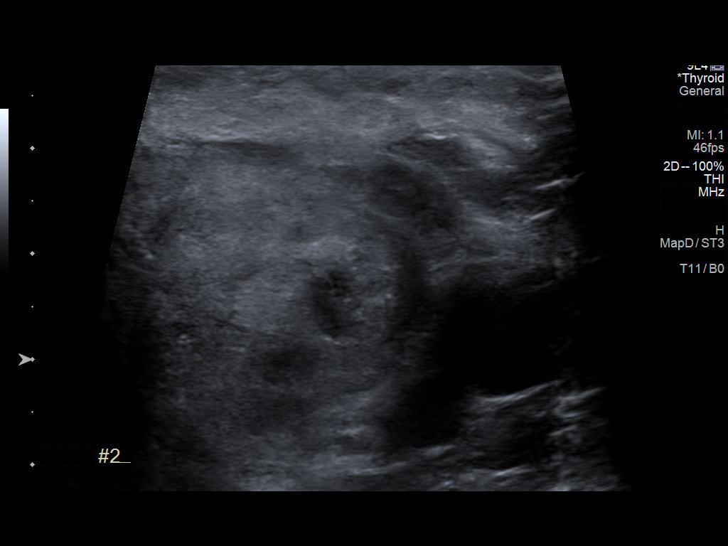
[im 8/12]
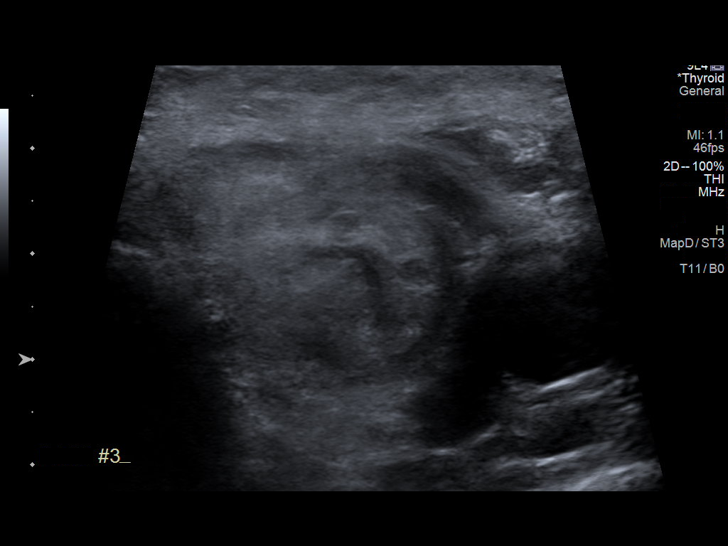
[im 9/12]
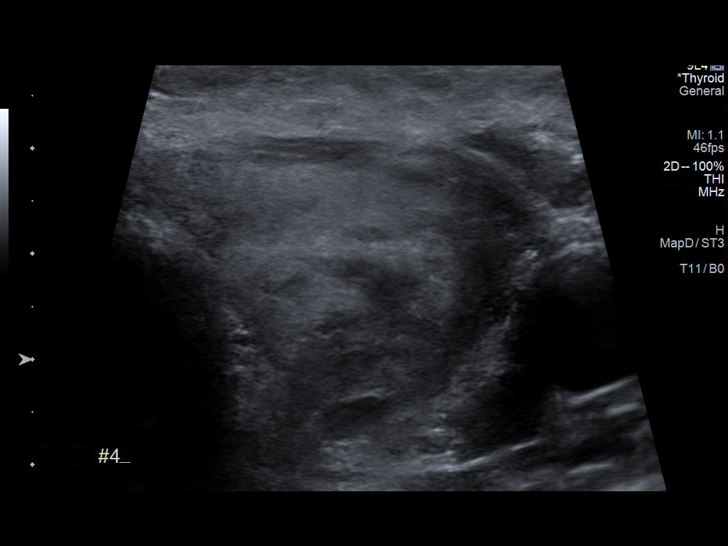
[im 10/12]
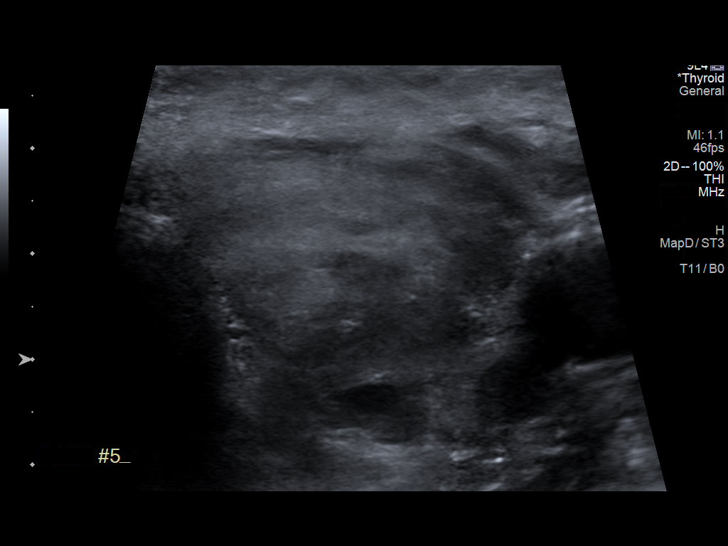
[im 11/12]
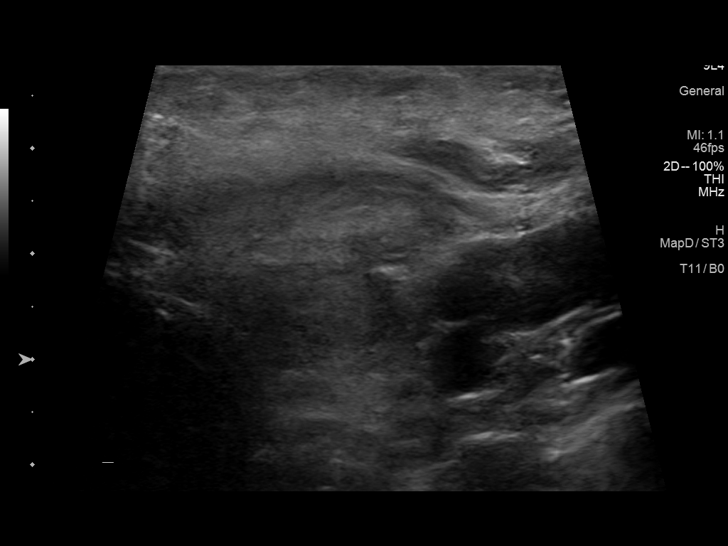
[im 12/12]
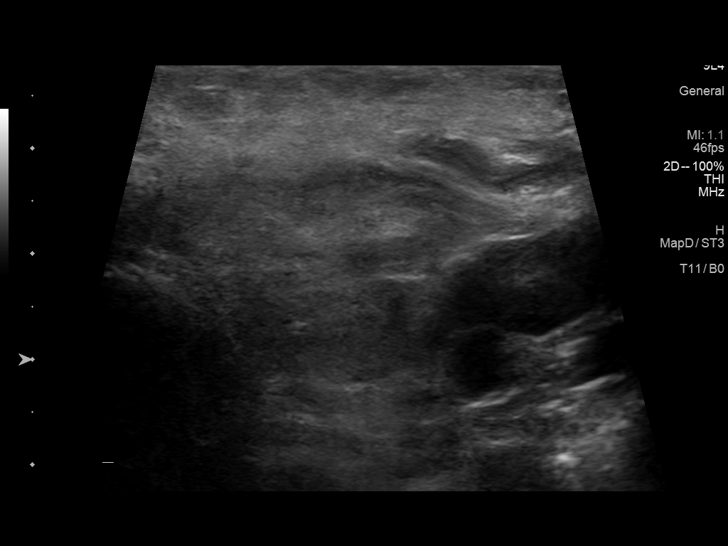

[12 of 12 positions shown; findings below may reference images not displayed]

Left inferior thyroid nodule biopsy 06/09/21

MEDICATIONS:
8 cc 1% lidocaine

COMPLICATIONS:
None immediate.
Pre-procedural ultrasound scanning demonstrated unchanged size and
appearance of the indeterminate nodule within the left thyroid

The procedure was planned. The neck was prepped in the usual sterile
fashion, and a sterile drape was applied covering the operative
field. A timeout was performed prior to the initiation of the
procedure. Local anesthesia was provided with 1% lidocaine.

Under direct ultrasound guidance, 5 FNA biopsies were performed of
the left inferior thyroid nodule with a 27 gauge needle.

2 of these samples were obtained for AFIRMA

Multiple ultrasound images were saved for procedural documentation
purposes. The samples were prepared and submitted to pathology.

Limited post procedural scanning was negative for hematoma or
additional complication. Dressings were placed. The patient
tolerated the above procedures procedure well without immediate
postprocedural complication.
FINDINGS: Nodule reference number based on prior diagnostic ultrasound: 6

Maximum size: 2.6 cm

Location: Left; Inferior

ACR TI-RADS risk category: TR3 (3 points)

Reason for biopsy: indeterminate (FLUS, HIBARI, Follicular neoplasm) on
prior biopsy

Ultrasound imaging confirms appropriate placement of the needles
within the thyroid nodule.
IMPRESSION: Technically successful ultrasound guided fine needle aspiration of
left inferior thyroid nodule.

Previous biopsy of same nodule 06/09/21: [REDACTED] 1

Read by

Ostoks Seong Joji

## 2023-05-11 ENCOUNTER — Encounter: Payer: Self-pay | Admitting: Family Medicine

## 2023-05-11 ENCOUNTER — Ambulatory Visit: Payer: BLUE CROSS/BLUE SHIELD | Admitting: Family Medicine

## 2023-05-11 VITALS — BP 122/82 | HR 75 | Temp 97.6°F | Ht 63.0 in | Wt 154.0 lb

## 2023-05-11 DIAGNOSIS — Z124 Encounter for screening for malignant neoplasm of cervix: Secondary | ICD-10-CM

## 2023-05-11 DIAGNOSIS — K760 Fatty (change of) liver, not elsewhere classified: Secondary | ICD-10-CM

## 2023-05-11 DIAGNOSIS — E559 Vitamin D deficiency, unspecified: Secondary | ICD-10-CM

## 2023-05-11 DIAGNOSIS — M722 Plantar fascial fibromatosis: Secondary | ICD-10-CM | POA: Insufficient documentation

## 2023-05-11 DIAGNOSIS — R7303 Prediabetes: Secondary | ICD-10-CM | POA: Diagnosis not present

## 2023-05-11 DIAGNOSIS — E538 Deficiency of other specified B group vitamins: Secondary | ICD-10-CM

## 2023-05-11 DIAGNOSIS — E786 Lipoprotein deficiency: Secondary | ICD-10-CM | POA: Diagnosis not present

## 2023-05-11 LAB — CBC WITH DIFFERENTIAL/PLATELET
Basophils Absolute: 0 10*3/uL (ref 0.0–0.1)
Basophils Relative: 0.6 % (ref 0.0–3.0)
Eosinophils Absolute: 0.3 10*3/uL (ref 0.0–0.7)
Eosinophils Relative: 5.6 % — ABNORMAL HIGH (ref 0.0–5.0)
HCT: 39.6 % (ref 36.0–46.0)
Hemoglobin: 13 g/dL (ref 12.0–15.0)
Lymphocytes Relative: 36.8 % (ref 12.0–46.0)
Lymphs Abs: 1.8 10*3/uL (ref 0.7–4.0)
MCHC: 32.8 g/dL (ref 30.0–36.0)
MCV: 90.5 fl (ref 78.0–100.0)
Monocytes Absolute: 0.5 10*3/uL (ref 0.1–1.0)
Monocytes Relative: 10 % (ref 3.0–12.0)
Neutro Abs: 2.3 10*3/uL (ref 1.4–7.7)
Neutrophils Relative %: 47 % (ref 43.0–77.0)
Platelets: 237 10*3/uL (ref 150.0–400.0)
RBC: 4.37 Mil/uL (ref 3.87–5.11)
RDW: 14.1 % (ref 11.5–15.5)
WBC: 4.9 10*3/uL (ref 4.0–10.5)

## 2023-05-11 LAB — LIPID PANEL
Cholesterol: 182 mg/dL (ref 0–200)
HDL: 39.3 mg/dL (ref >39.00)
LDL Cholesterol: 114 mg/dL — ABNORMAL HIGH (ref 0–99)
NonHDL: 142.88
Total CHOL/HDL Ratio: 5
Triglycerides: 146 mg/dL (ref 0.0–149.0)
VLDL: 29.2 mg/dL (ref 0.0–40.0)

## 2023-05-11 LAB — HEPATIC FUNCTION PANEL
ALT: 26 U/L (ref 0–35)
AST: 18 U/L (ref 0–37)
Albumin: 4.1 g/dL (ref 3.5–5.2)
Alkaline Phosphatase: 78 U/L (ref 39–117)
Bilirubin, Direct: 0.1 mg/dL (ref 0.0–0.3)
Total Bilirubin: 0.6 mg/dL (ref 0.2–1.2)
Total Protein: 7.3 g/dL (ref 6.0–8.3)

## 2023-05-11 LAB — BASIC METABOLIC PANEL
BUN: 15 mg/dL (ref 6–23)
CO2: 28 mEq/L (ref 19–32)
Calcium: 9.4 mg/dL (ref 8.4–10.5)
Chloride: 105 mEq/L (ref 96–112)
Creatinine, Ser: 0.8 mg/dL (ref 0.40–1.20)
GFR: 85.38 mL/min (ref >60.00)
Glucose, Bld: 95 mg/dL (ref 70–99)
Potassium: 4.2 mEq/L (ref 3.5–5.1)
Sodium: 140 mEq/L (ref 135–145)

## 2023-05-11 LAB — VITAMIN D 25 HYDROXY (VIT D DEFICIENCY, FRACTURES): VITD: 35.01 ng/mL (ref 30.00–100.00)

## 2023-05-11 LAB — VITAMIN B12: Vitamin B-12: 270 pg/mL (ref 211–911)

## 2023-05-11 LAB — HEMOGLOBIN A1C: Hgb A1c MFr Bld: 5.7 % (ref 4.6–6.5)

## 2023-05-11 NOTE — Assessment & Plan Note (Signed)
Taking OTC vitamin B12 1,000 mcg daily.  Recheck B12 level and follow up.

## 2023-05-11 NOTE — Progress Notes (Signed)
Subjective:     Patient ID: Becky Cruz, female    DOB: [birth year], 52 y.o.   MRN: [id number]  Chief Complaint  Patient presents with   Medical Management of Chronic Issues    3 month f/u, would like to discuss her weight and diet    HPI  Discussed the use of AI scribe software for clinical note transcription with the patient, who gave verbal consent to proceed.  History of Present Illness         Here for follow up on chronic health conditions.   Last pap smear done in [place] 3 years ago.   Recent vacation in [place]. Feels well.   Weight has been stable.    Health Maintenance Due  Topic Date Due   DTaP/Tdap/Td (2 - Td or Tdap) 11/30/2020   Zoster Vaccines- Shingrix (1 of 2) Never done   PAP SMEAR-Modifier  11/19/2022   INFLUENZA VACCINE  05/03/2023    Past Medical History:  Diagnosis Date   ANA positive 09/17/2022   Anemia    Constipation 02/25/2016   Dysrhythmia    Palpitations 12/08/2016   Pregnancy induced hypertension     Past Surgical History:  Procedure Laterality Date   WISDOM TOOTH EXTRACTION      Family History  Problem Relation Age of Onset   Diabetes Mother    Hypertension Mother    Hypertension Sister    Diabetes Sister    Diabetes Maternal Grandmother    Anesthesia problems Neg Hx    Hypotension Neg Hx    Malignant hyperthermia Neg Hx    Pseudochol deficiency Neg Hx     Social History   Socioeconomic History   Marital status: Married    Spouse name: Not on file   Number of children: Not on file   Years of education: Not on file   Highest education level: Not on file  Occupational History   Not on file  Tobacco Use   Smoking status: Never    Passive exposure: Never   Smokeless tobacco: Never  Vaping Use   Vaping status: Never Used  Substance and Sexual Activity   Alcohol use: No   Drug use: No   Sexual activity: Yes    Birth control/protection: None, Condom  Other Topics Concern   Not on file  Social History  Narrative   Originally from [place].   Social Determinants of Health   Financial Resource Strain: Not on file  Food Insecurity: Not on file  Transportation Needs: Not on file  Physical Activity: Not on file  Stress: Not on file  Social Connections: Not on file  Intimate Partner Violence: Not on file    Outpatient Medications Prior to Visit  Medication Sig Dispense Refill   acetaminophen (TYLENOL) 500 MG tablet Take 500 mg by mouth every 6 (six) hours as needed for moderate pain.     ascorbic acid (VITAMIN C) 500 MG tablet Take 500 mg by mouth every other day.     Cholecalciferol (VITAMIN D) 50 MCG (2000 UT) tablet Take 4,000 Units by mouth every other day.     fluticasone (FLONASE) 50 MCG/ACT nasal spray Place 1 spray into both nostrils daily. (Patient taking differently: Place 1 spray into both nostrils daily as needed for allergies.) 11.1 mL 2   loratadine (CLARITIN) 10 MG tablet Take 10 mg by mouth daily as needed for allergies.     Multiple Vitamin (MULTIVITAMIN WITH MINERALS) TABS tablet Take 1 tablet by mouth daily.  Omega-3 Fatty Acids (OMEGA 3 PO) Take by mouth.     vitamin B-12 (CYANOCOBALAMIN) 100 MCG tablet Take 100 mcg by mouth daily.     No facility-administered medications prior to visit.    No Known Allergies  Review of Systems  Constitutional:  Negative for chills, fever, malaise/fatigue and weight loss.  Respiratory:  Negative for shortness of breath.   Cardiovascular:  Negative for chest pain, palpitations and leg swelling.  Gastrointestinal:  Negative for abdominal pain, constipation, diarrhea, nausea and vomiting.  Genitourinary:  Negative for dysuria, frequency and urgency.  Neurological:  Negative for dizziness, weakness and headaches.       Objective:    Physical Exam Constitutional:      General: She is not in acute distress.    Appearance: She is not ill-appearing.  Eyes:     Extraocular Movements: Extraocular movements intact.      Conjunctiva/sclera: Conjunctivae normal.  Cardiovascular:     Rate and Rhythm: Normal rate.  Pulmonary:     Effort: Pulmonary effort is normal.  Musculoskeletal:     Cervical back: Normal range of motion and neck supple.  Skin:    General: Skin is warm and dry.  Neurological:     General: No focal deficit present.     Mental Status: She is alert and oriented to person, place, and time.  Psychiatric:        Mood and Affect: Mood normal.        Behavior: Behavior normal.        Thought Content: Thought content normal.      BP 122/82 (BP Location: Left Arm, Patient Position: Sitting, Cuff Size: Large)   Pulse 75   Temp 97.6 F (36.4 C) (Temporal)   Ht 5\' 3"  (1.6 m)   Wt 154 lb (69.9 kg)   LMP 11/25/2012 Comment: stopped breast feeding Nov2014  SpO2 99%   BMI 27.28 kg/m  Wt Readings from Last 3 Encounters:  05/11/23 154 lb (69.9 kg)  01/17/23 157 lb (71.2 kg)  11/10/22 170 lb (77.1 kg)       Assessment & Plan:   Problem List Items Addressed This Visit       Digestive   NAFLD (nonalcoholic fatty liver disease)    Reviewed results from ultrasound in 2022 showing mild hepatic steatosis.  Elevated ALT at rheumatology appointment.  We will recheck liver function tests today.  Discussed that weight loss is the best treatment for fatty liver disease.      Relevant Orders   Basic metabolic panel (Completed)   Hepatic function panel (Completed)   CBC with Differential/Platelet (Completed)     Other   B12 deficiency    Taking OTC vitamin B12 1,000 mcg daily.  Recheck B12 level and follow up.       Relevant Orders   Vitamin B12 (Completed)   Low HDL (under 40)    Check fasting lipids      Relevant Orders   Lipid panel (Completed)   Prediabetes - Primary    Recheck A1c. She is doing well on low sugar, low carbohydrate diet.       Relevant Orders   Basic metabolic panel (Completed)   Hemoglobin A1c (Completed)   CBC with Differential/Platelet (Completed)    Vitamin D deficiency    Check vitamin D level. She is taking a supplement.       Relevant Orders   VITAMIN D 25 Hydroxy (Vit-D Deficiency, Fractures) (Completed)   Other Visit Diagnoses  Pap smear for cervical cancer screening       Relevant Orders   Ambulatory referral to Gynecology       I am having Ethylene T. Stockburger maintain her fluticasone, multivitamin with minerals, ascorbic acid, Vitamin D, acetaminophen, loratadine, vitamin B-12, and Omega-3 Fatty Acids (OMEGA 3 PO).  No orders of the defined types were placed in this encounter.

## 2023-05-11 NOTE — Assessment & Plan Note (Signed)
Check fasting lipids 

## 2023-05-11 NOTE — Assessment & Plan Note (Signed)
Check vitamin D level. She is taking a supplement.

## 2023-05-11 NOTE — Patient Instructions (Signed)
Please go downstairs for labs before you leave.  Continue staying well-hydrated, multivitamins, nutritious diet and try to get at least 150 minutes of physical activity each week.  We will be in touch with your results and with recommendations.  I have referred you to a gynecologist and they should call you to schedule a visit for your Pap smear and female exam.

## 2023-05-11 NOTE — Assessment & Plan Note (Signed)
Reviewed results from ultrasound in 2022 showing mild hepatic steatosis.  Elevated ALT at rheumatology appointment.  We will recheck liver function tests today.  Discussed that weight loss is the best treatment for fatty liver disease.

## 2023-05-11 NOTE — Assessment & Plan Note (Signed)
Recheck A1c. She is doing well on low sugar, low carbohydrate diet.

## 2023-08-09 ENCOUNTER — Ambulatory Visit: Payer: BLUE CROSS/BLUE SHIELD | Admitting: Internal Medicine

## 2023-08-09 NOTE — Progress Notes (Deleted)
Name: Becky Cruz  MRN/ DOB: [id number], 1970-12-28    Age/ Sex: 52 y.o., female    PCP: Avanell Shackleton, NP-C   Reason for Endocrinology Evaluation: MNG     Date of Initial Endocrinology Evaluation: 01/04/2022    HPI: Ms. Becky Cruz is a 52 y.o. female with a past medical history of multinodular goiter, benign positional vertigo, GERD. The patient presented for initial endocrinology clinic visit on 01/04/2022  for consultative assistance with her MNG.   Patient has been diagnosed with multinodular goiter in 2019.  She is status post FNA of the left inferior 2.6 cm nodule with scant follicular epithelium (Bethesda category I).  Repeat FNA on 08/02/2021 again showed scant follicular epithelium.  A third attempt was made to biopsy the left inferior thyroid nodule on 08/17/2021 with a cytology report consistent with follicular lesion of undetermined significance (Bethesda category III) but Afirma came back as benign   Mother with thyroid disease   No prior exposure to radiation   Has 4 kids between the ages 11-22    SUBJECTIVE:    Today (08/09/23):  Becky Cruz is here for follow-up on multinodular goiter.  Weight has been stable  Today she has stable local neck swelling  Denies dysphagia  Denies constipation or diarrhea  Denies palpitations  Denies tremors  No biotin  She did feel tired last week after getting on the treadmill for 20 mins, she typically does not use the treadmill    HISTORY:  Past Medical History:  Past Medical History:  Diagnosis Date   ANA positive 09/17/2022   Anemia    Constipation 02/25/2016   Dysrhythmia    Palpitations 12/08/2016   Pregnancy induced hypertension    Past Surgical History:  Past Surgical History:  Procedure Laterality Date   WISDOM TOOTH EXTRACTION      Social History:  reports that she has never smoked. She has never been exposed to tobacco smoke. She has never used smokeless tobacco. She reports that she does not drink  alcohol and does not use drugs. Family History: family history includes Diabetes in her maternal grandmother, mother, and sister; Hypertension in her mother and sister.   HOME MEDICATIONS: Allergies as of 08/09/2023   No Known Allergies      Medication List        Accurate as of August 09, 2023  6:45 AM. If you have any questions, ask your nurse or doctor.          acetaminophen 500 MG tablet Commonly known as: TYLENOL Take 500 mg by mouth every 6 (six) hours as needed for moderate pain.   ascorbic acid 500 MG tablet Commonly known as: VITAMIN C Take 500 mg by mouth every other day.   fluticasone 50 MCG/ACT nasal spray Commonly known as: FLONASE Place 1 spray into both nostrils daily. What changed:  when to take this reasons to take this   loratadine 10 MG tablet Commonly known as: CLARITIN Take 10 mg by mouth daily as needed for allergies.   multivitamin with minerals Tabs tablet Take 1 tablet by mouth daily.   OMEGA 3 PO Take by mouth.   vitamin B-12 100 MCG tablet Commonly known as: CYANOCOBALAMIN Take 100 mcg by mouth daily.   Vitamin D 50 MCG (2000 UT) tablet Take 4,000 Units by mouth every other day.          REVIEW OF SYSTEMS: A comprehensive ROS was conducted with the patient and is  negative except as per HPI    OBJECTIVE:  VS: LMP 11/25/2012 Comment: stopped breast feeding Nov2014   Wt Readings from Last 3 Encounters:  05/11/23 154 lb (69.9 kg)  01/17/23 157 lb (71.2 kg)  11/10/22 170 lb (77.1 kg)     EXAM: General: Pt appears well and is in NAD  Neck: General: Supple without adenopathy. Thyroid: Bilateral nodules  appreciated R>L . No thyroid bruit.  Lungs: Clear with good BS bilat with no rales, rhonchi, or wheezes  Heart: Auscultation: RRR.  Abdomen: Normoactive bowel sounds, soft, nontender, without masses or organomegaly palpable  Extremities:  BL LE: No pretibial edema normal ROM and strength.  Mental Status: Judgment,  insight: Intact Orientation: Oriented to time, place, and person Mood and affect: No depression, anxiety, or agitation     DATA REVIEWED:  Latest Reference Range & Units 07/14/21 16:19  TSH 0.450 - 4.500 uIU/mL 0.537  Triiodothyronine (T3) 71 - 180 ng/dL 093  A3,FTDD(UKGURK) 2.70 - 1.77 ng/dL 6.23      Thyroid ultrasound 08/11/2022  DINGS: Parenchymal Echotexture: Markedly heterogenous   Isthmus: 0.6 cm   Right lobe: 6.1 x 2.4 x 2.1 cm   Left lobe: 7.1 x 3.7 x 3.4 cm   _________________________________________________________   Estimated total number of nodules >/= 1 cm: 5   Number of spongiform nodules >/=  2 cm not described below (TR1): 0   Number of mixed cystic and solid nodules >/= 1.5 cm not described below (TR2): 0   _________________________________________________________   Nodule # 1:   Location: RIGHT; Mid   Maximum size: 2.7 cm; Other 2 dimensions: 2.0 x 1.1 cm, previously 1.6 x 2.0 x 0.9 cm   No aggressive features on today's evaluation. This nodule was previously (most recently) biopsied in 04/17/2018.   Assuming a benign pathologic diagnosis, repeat sampling and/or dedicated follow-up is not recommended.   _________________________________________________________   Nodule # 4:   Location: RIGHT; Mid   Maximum size: 1.8 cm; Other 2 dimensions: 1.0 x 1.0 cm, previously 1.61.4 x 1.3 cm   Composition: solid/almost completely solid (2)   Echogenicity: hyperechoic (1)   Shape: not taller-than-wide (0)   Margins: ill-defined (0)   Echogenic foci: none (0)   ACR TI-RADS total points: 3.   ACR TI-RADS risk category: TR3 (3 points).   ACR TI-RADS recommendations:   *Given size (>/= 1.5 - 2.4 cm) and appearance, a follow-up ultrasound in 1 year should be considered based on TI-RADS criteria.   _________________________________________________________   Nodule # 6:   Location: LEFT; Inferior   Maximum size: 2.1 cm; Other 2  dimensions: 2.0 x 2.0 cm, previously 1.9 x 2.6 x 2.5 cm   No aggressive features on today's evaluation. This nodule was previously (most recently) biopsied in 08/17/2021.   Assuming a benign pathologic diagnosis, repeat sampling and/or dedicated follow-up is not recommended.   _________________________________________________________   Additional nodules, such as labeled # 2 (0.9 cm RIGHT mid solid TR-3), # 3 (1.4 cm RIGHT mid solid TR-3) # 5 (4.8 cm LEFT mid mixed cystic and solid TR-2).   No cervical adenopathy or abnormal fluid collection within the imaged neck.   IMPRESSION: 1. Enlarged multinodular thyroid, consistent with a goiter. 2. 1.8 cm RIGHT mid TR-3 thyroid nodule. A follow-up ultrasound in 1 year should be considered based on TI-RADS criteria. 3. 2.7 cm RIGHT mid and 2.1 cm LEFT inferior thyroid nodules were previously biopsied. Assuming a benign pathologic diagnosis, repeat sampling and/or dedicated follow-up  is not recommended. 4. Additional nodules, as described above, do not meet size or appearance threshold for follow-up nor biopsy.      FNA left inferior 08/17/2021 Clinical History: History of multinodular goiter, post US guided repeat  Bx of inferior left 2.6cm (TR3) thyroid nodule. (Note: pt has had two  previous non-Dx Bx's of this same nodule)  Specimen Submitted:  A. THYROID, LEFT, FINE NEEDLE ASPIRATION:    FINAL MICROSCOPIC DIAGNOSIS:  - Follicular lesion of undetermined significance (Bethesda category III)    Afirma Benign   FNA right mid nodule 09/12/2022  Clinical History: Right Mid maximum size: 2.7cm;  Other 2 dimensions:  2.0 x 1.1cm previously 1.6 x 2.0 x 0.9cm  Specimen Submitted:  A. THYROID, RMP, FINE NEEDLE ASPIRATION:    FINAL MICROSCOPIC DIAGNOSIS:  - Consistent with benign follicular nodule (Bethesda category II)   ASSESSMENT/PLAN/RECOMMENDATIONS:   Multinodular goiter:   -Patient is clinically and biochemically  euthyroid -No local neck symptoms -She is status post FNA of the left inferior 2.6 cm nodule, with atypia of undetermined significance but Afirma came back benign -The rest of her nodules have either remained stable or showed regression - Will repeat thyroid ultrasound this year  - Thyroid function normal   Follow-up 1 yr    Signed electronically by: Lyndle Herrlich, MD  [facility] Endocrinology  [facility] [address] [place] [place], [place] [address] Phone: [phone] FAX: [phone]   CC: Avanell Shackleton, NP-C [address] [place] [address] Phone: [phone] Fax: [phone]   Return to Endocrinology clinic as below: Future Appointments  Date Time Provider Department Center  08/09/2023  7:30 AM Sherlyn Ebbert, Konrad Dolores, MD [id number] None

## 2023-11-05 NOTE — Progress Notes (Signed)
 Name: Becky Cruz  MRN/ DOB: [id number], 05/04/1971    Age/ Sex: 53 y.o., female    PCP: Lendia Boby CROME, NP-C   Reason for Endocrinology Evaluation: MNG     Date of Initial Endocrinology Evaluation: 01/04/2022    HPI: Becky Cruz is a 53 y.o. female with a past medical history of multinodular goiter, benign positional vertigo, GERD. The patient presented for initial endocrinology clinic visit on 01/04/2022  for consultative assistance with her MNG.   Patient has been diagnosed with multinodular goiter in 2019.  She is status post FNA of the left inferior 2.6 cm nodule with scant follicular epithelium (Bethesda category I).  Repeat FNA on 08/02/2021 again showed scant follicular epithelium.  A third attempt was made to biopsy the left inferior thyroid  nodule on 08/17/2021 with a cytology report consistent with follicular lesion of undetermined significance (Bethesda category III) but Afirma came back as benign   Mother with thyroid  disease   No prior exposure to radiation   Has 4 kids between the ages 12-23    SUBJECTIVE:    Today (11/06/23):  Becky Cruz is here for follow-up on multinodular goiter.  Weight continues to fluctuate  Today she has stable local neck swelling  nor dysphagia  Denies constipation or diarrhea  Denies palpitations  Denies tremors  No biotin      HISTORY:  Past Medical History:  Past Medical History:  Diagnosis Date  . ANA positive 09/17/2022  . Anemia   . Constipation 02/25/2016  . Dysrhythmia   . Palpitations 12/08/2016  . Pregnancy induced hypertension    Past Surgical History:  Past Surgical History:  Procedure Laterality Date  . WISDOM TOOTH EXTRACTION      Social History:  reports that she has never smoked. She has never been exposed to tobacco smoke. She has never used smokeless tobacco. She reports that she does not drink alcohol and does not use drugs. Family History: family history includes Diabetes in her maternal  grandmother, mother, and sister; Hypertension in her mother and sister.   HOME MEDICATIONS: Allergies as of 11/06/2023   No Known Allergies      Medication List        Accurate as of November 06, 2023  8:24 AM. If you have any questions, ask your nurse or doctor.          acetaminophen  500 MG tablet Commonly known as: TYLENOL  Take 500 mg by mouth every 6 (six) hours as needed for moderate pain.   ascorbic acid 500 MG tablet Commonly known as: VITAMIN C Take 500 mg by mouth every other day.   fluticasone  50 MCG/ACT nasal spray Commonly known as: FLONASE  Place 1 spray into both nostrils daily. What changed:  when to take this reasons to take this   loratadine 10 MG tablet Commonly known as: CLARITIN Take 10 mg by mouth daily as needed for allergies.   multivitamin with minerals Tabs tablet Take 1 tablet by mouth daily.   OMEGA 3 PO Take by mouth.   vitamin B-12 100 MCG tablet Commonly known as: CYANOCOBALAMIN  Take 100 mcg by mouth daily.   Vitamin D  50 MCG (2000 UT) tablet Take 4,000 Units by mouth every other day.          REVIEW OF SYSTEMS: A comprehensive ROS was conducted with the patient and is negative except as per HPI    OBJECTIVE:  VS: BP 110/70 (BP Location: Left Arm, Patient Position: Sitting,  Cuff Size: Small)   Pulse 71   Ht 5' 3 (1.6 m)   Wt 162 lb (73.5 kg)   LMP 11/25/2012 Comment: stopped breast feeding Nov2014  SpO2 99%   BMI 28.70 kg/m    Wt Readings from Last 3 Encounters:  11/06/23 162 lb (73.5 kg)  05/11/23 154 lb (69.9 kg)  01/17/23 157 lb (71.2 kg)     EXAM: General: Pt appears well and is in NAD  Neck: General: Supple without adenopathy. Thyroid : Bilateral nodules  appreciated R>L . No thyroid  bruit.  Lungs: Clear with good BS bilat  Heart: Auscultation: RRR.  Abdomen: soft, nontender  Extremities:  BL LE: No pretibial edema normal   Mental Status: Judgment, insight: Intact Orientation: Oriented to time,  place, and person Mood and affect: No depression, anxiety, or agitation     DATA REVIEWED:   Latest Reference Range & Units 11/06/23 10:58  TSH mIU/L 0.47  Triiodothyronine,Free,Serum 2.3 - 4.2 pg/mL 3.6  T4,Free(Direct) 0.8 - 1.8 ng/dL 1.1       Thyroid  ultrasound 08/11/2022   Previous repeat biopsies of RIGHT mid thyroid  nodule (04/03/2018 and 04/17/2018), and LEFT inferior/mid thyroid  nodule (06/09/2021, 08/02/2021 and 08/17/2021).   EXAM: THYROID  ULTRASOUND   TECHNIQUE: Ultrasound examination of the thyroid  gland and adjacent soft tissues was performed.   COMPARISON:  US  Thyroid , 05/18/2021. Ultrasound thyroid  biopsy, 08/17/2021, 08/02/2021 and 06/09/2021.   FINDINGS: Parenchymal Echotexture: Markedly heterogenous   Isthmus: 0.6 cm   Right lobe: 6.1 x 2.4 x 2.1 cm   Left lobe: 7.1 x 3.7 x 3.4 cm   _________________________________________________________   Estimated total number of nodules >/= 1 cm: 5   Number of spongiform nodules >/=  2 cm not described below (TR1): 0   Number of mixed cystic and solid nodules >/= 1.5 cm not described below (TR2): 0   _________________________________________________________   Nodule # 1:   Location: RIGHT; Mid   Maximum size: 2.7 cm; Other 2 dimensions: 2.0 x 1.1 cm, previously 1.6 x 2.0 x 0.9 cm   No aggressive features on today's evaluation. This nodule was previously (most recently) biopsied in 04/17/2018.   Assuming a benign pathologic diagnosis, repeat sampling and/or dedicated follow-up is not recommended.   _________________________________________________________   Nodule # 4:   Location: RIGHT; Mid   Maximum size: 1.8 cm; Other 2 dimensions: 1.0 x 1.0 cm, previously 1.61.4 x 1.3 cm   Composition: solid/almost completely solid (2)   Echogenicity: hyperechoic (1)   Shape: not taller-than-wide (0)   Margins: ill-defined (0)   Echogenic foci: none (0)   ACR TI-RADS total points: 3.    ACR TI-RADS risk category: TR3 (3 points).   ACR TI-RADS recommendations:   *Given size (>/= 1.5 - 2.4 cm) and appearance, a follow-up ultrasound in 1 year should be considered based on TI-RADS criteria.   _________________________________________________________   Nodule # 6:   Location: LEFT; Inferior   Maximum size: 2.1 cm; Other 2 dimensions: 2.0 x 2.0 cm, previously 1.9 x 2.6 x 2.5 cm   No aggressive features on today's evaluation. This nodule was previously (most recently) biopsied in 08/17/2021.   Assuming a benign pathologic diagnosis, repeat sampling and/or dedicated follow-up is not recommended.   _________________________________________________________   Additional nodules, such as labeled # 2 (0.9 cm RIGHT mid solid TR-3), # 3 (1.4 cm RIGHT mid solid TR-3) # 5 (4.8 cm LEFT mid mixed cystic and solid TR-2).   No cervical adenopathy or abnormal fluid collection within  the imaged neck.   IMPRESSION: 1. Enlarged multinodular thyroid , consistent with a goiter. 2. 1.8 cm RIGHT mid TR-3 thyroid  nodule. A follow-up ultrasound in 1 year should be considered based on TI-RADS criteria. 3. 2.7 cm RIGHT mid and 2.1 cm LEFT inferior thyroid  nodules were previously biopsied. Assuming a benign pathologic diagnosis, repeat sampling and/or dedicated follow-up is not recommended. 4. Additional nodules, as described above, do not meet size or appearance threshold for follow-up nor biopsy   FNA left inferior 08/17/2021 Clinical History: History of multinodular goiter, post US  guided repeat  Bx of inferior left 2.6cm (TR3) thyroid  nodule. (Note: pt has had two  previous non-Dx Bx's of this same nodule)  Specimen Submitted:  A. THYROID , LEFT, FINE NEEDLE ASPIRATION:    FINAL MICROSCOPIC DIAGNOSIS:  - Follicular lesion of undetermined significance (Bethesda category III)    Afirma Benign   Right Mid Nodule FNA 09/12/2022  Clinical History: Right Mid maximum size:  2.7cm;  Other 2 dimensions:  2.0 x 1.1cm previously 1.6 x 2.0 x 0.9cm  Specimen Submitted:  A. THYROID , RMP, FINE NEEDLE ASPIRATION:    FINAL MICROSCOPIC DIAGNOSIS:  - Consistent with benign follicular nodule (Bethesda category II)      ASSESSMENT/PLAN/RECOMMENDATIONS:   Multinodular goiter:   -Patient is clinically and biochemically euthyroid -No local neck symptoms -She is status post FNA of the left inferior 2.6 cm nodule, with atypia of undetermined significance but Afirma came back benign -The rest of her nodules have either remained stable or showed regression - Will repeat thyroid  ultrasound this year  - Thyroid  function remains normal  Follow-up 1 yr    Signed electronically by: Stefano Redgie Butts, MD  [facility] Endocrinology  [facility] [address] [place] [place], [place] [address] Phone: [phone] FAX: [phone]   CC: Lendia Boby CROME, NP-C [address] [place] [address] Phone: [phone] Fax: [phone]   Return to Endocrinology clinic as below: No future appointments.

## 2023-11-06 ENCOUNTER — Encounter: Payer: Self-pay | Admitting: Internal Medicine

## 2023-11-06 ENCOUNTER — Ambulatory Visit (INDEPENDENT_AMBULATORY_CARE_PROVIDER_SITE_OTHER): Payer: No Typology Code available for payment source | Admitting: Internal Medicine

## 2023-11-06 VITALS — BP 110/70 | HR 71 | Ht 63.0 in | Wt 162.0 lb

## 2023-11-06 DIAGNOSIS — E042 Nontoxic multinodular goiter: Secondary | ICD-10-CM

## 2023-11-07 ENCOUNTER — Encounter: Payer: Self-pay | Admitting: Internal Medicine

## 2023-11-07 LAB — T3, FREE: T3, Free: 3.6 pg/mL (ref 2.3–4.2)

## 2023-11-07 LAB — TSH: TSH: 0.47 m[IU]/L

## 2023-11-07 LAB — T4, FREE: Free T4: 1.1 ng/dL (ref 0.8–1.8)

## 2023-11-08 ENCOUNTER — Ambulatory Visit
Admission: RE | Admit: 2023-11-08 | Discharge: 2023-11-08 | Disposition: A | Discharge status: Home / Self Care | Payer: No Typology Code available for payment source | Source: Ambulatory Visit | Attending: Internal Medicine | Admitting: Internal Medicine

## 2023-11-08 DIAGNOSIS — E042 Nontoxic multinodular goiter: Secondary | ICD-10-CM

## 2023-11-13 ENCOUNTER — Encounter: Payer: Self-pay | Admitting: Internal Medicine

## 2023-11-16 ENCOUNTER — Telehealth: Payer: Self-pay | Admitting: Internal Medicine

## 2023-11-16 NOTE — Telephone Encounter (Signed)
Attempted to contact the pt on 11/16/2023 at 12:30 pm    NO answer , voice mail left  to check portal or call back    The left mid nodule # 5 , has increased in size and currently at 5.67 cm in diameter. Spongiform     I would recommend intervention due to size   Abby Raelyn Mora, MD  [facility] Endocrinology  [facility] [address] [place], [place] [address] Phone: [phone] FAX: [phone]

## 2024-01-22 ENCOUNTER — Other Ambulatory Visit: Payer: Self-pay | Admitting: Internal Medicine

## 2024-01-22 ENCOUNTER — Encounter: Payer: Self-pay | Admitting: Internal Medicine

## 2024-01-22 DIAGNOSIS — E042 Nontoxic multinodular goiter: Secondary | ICD-10-CM

## 2024-01-28 ENCOUNTER — Ambulatory Visit
Admission: RE | Admit: 2024-01-28 | Discharge: 2024-01-28 | Disposition: A | Discharge status: Home / Self Care | Source: Ambulatory Visit | Attending: Internal Medicine | Admitting: Internal Medicine

## 2024-01-28 DIAGNOSIS — E042 Nontoxic multinodular goiter: Secondary | ICD-10-CM

## 2024-01-28 HISTORY — PX: IR RADIOLOGIST EVAL & MGMT: IMG5224

## 2024-01-28 NOTE — Progress Notes (Signed)
 This encounter was conducted via the [employer] providing interactive audio and visual communication.  The patient provided verbal consent to conduct a virtual appointment.  The patient was located at their primary residence during this encounter.  Chief Complaint: Patient was seen in virtual video consultation today for symptomatic thyroid  nodules  Referring Physician(s): Shamleffer,Ibtehal Jaralla  History of Present Illness: Becky Cruz is a 53 y.o. female with a past medical history significant for benign positional vertigo, B12 deficiency, NAFLD, anemia, palpitations and multinodular goiter. She is familiar to IR from numerous bilateral thyroid  nodule biopsies, all of which have been benign. She is followed by Endocrinology and an US  thyroid  11/08/23 shows several nodules in the left lobe have slightly increased in size.   On physical exam the nodules are palpable and the patient has neck swelling. She has been referred to Interventional Radiology for possible treatment options including radiofrequency ablation. She presents today via virtual video visit for further discussion. She endorses discomfort in her neck, exacerbated by turning her head side to side.  When she does this, it often induces burping.  She endorses rare hoarseness in her voice.  She has no significant trouble swallowing.  The nodules are readily noticeable.  She has always been euthyroid without any hyper or hypothyroid symptoms.  She is very interested in her preserving her thyroid  gland and not taking thyroid  supplementation.   Thyroid  Cosmetic Score:  Readily detected cosmetic problem (Grade 4).  Past Medical History:  Diagnosis Date   ANA positive 09/17/2022   Anemia    Constipation 02/25/2016   Dysrhythmia    Palpitations 12/08/2016   Pregnancy induced hypertension     Past Surgical History:  Procedure Laterality Date   WISDOM TOOTH EXTRACTION      Allergies: Patient has no known  allergies.  Medications: Prior to Admission medications   Medication Sig Start Date End Date Taking? Authorizing Provider  acetaminophen  (TYLENOL ) 500 MG tablet Take 500 mg by mouth every 6 (six) hours as needed for moderate pain.    [provider]  ascorbic acid (VITAMIN C) 500 MG tablet Take 500 mg by mouth every other day.    [provider]  Cholecalciferol (VITAMIN D ) 50 MCG (2000 UT) tablet Take 4,000 Units by mouth every other day.    [provider]  fluticasone  (FLONASE ) 50 MCG/ACT nasal spray Place 1 spray into both nostrils daily. Patient taking differently: Place 1 spray into both nostrils daily as needed for allergies. 07/14/21   Aslam, Sadia, MD  loratadine (CLARITIN) 10 MG tablet Take 10 mg by mouth daily as needed for allergies.    [provider]  Multiple Vitamin (MULTIVITAMIN WITH MINERALS) TABS tablet Take 1 tablet by mouth daily.    [provider]  Omega-3 Fatty Acids (OMEGA 3 PO) Take by mouth.    [provider]  vitamin B-12 (CYANOCOBALAMIN ) 100 MCG tablet Take 100 mcg by mouth daily.    [provider]     Family History  Problem Relation Age of Onset   Diabetes Mother    Hypertension Mother    Hypertension Sister    Diabetes Sister    Diabetes Maternal Grandmother    Anesthesia problems Neg Hx    Hypotension Neg Hx    Malignant hyperthermia Neg Hx    Pseudochol deficiency Neg Hx     Social History   Socioeconomic History   Marital status: Married    Spouse name: Not on file  Number of children: Not on file   Years of education: Not on file   Highest education level: Not on file  Occupational History   Not on file  Tobacco Use   Smoking status: Never    Passive exposure: Never   Smokeless tobacco: Never  Vaping Use   Vaping status: Never Used  Substance and Sexual Activity   Alcohol use: No   Drug use: No   Sexual activity: Yes    Birth control/protection: None, Condom  Other  Topics Concern   Not on file  Social History Narrative   Originally from [place].   Social Drivers of Corporate investment banker Strain: Not on file  Food Insecurity: Not on file  Transportation Needs: Not on file  Physical Activity: Not on file  Stress: Not on file  Social Connections: Not on file     Review of Systems: A 12 point ROS discussed and pertinent positives are indicated in the HPI above.  All other systems are negative.  Vital Signs: LMP 11/25/2012 Comment: stopped breast feeding [id number]  Physical Exam  Patient is alert, oriented and able to participate fully in the conversation. No apparent discomfort or distress observed. She appears appropriately dressed.    Imaging:   29.7 cc      Labs: CBC          Component Ref Range & Units (hover) 8 mo ago (05/11/23) 1 yr ago (11/10/22) 1 yr ago (09/13/22) 2 yr ago (12/13/21) 3 yr ago (11/23/20) 5 yr ago (03/07/18) 6 yr ago (11/19/17)  WBC 4.9 5.7 R 4.9 3.9 R 4.1 R 7.8 R 4.7 R  RBC 4.37 4.40 R 4.49 4.59 R 4.73 R 4.78 R 4.65 R  Hemoglobin 13.0 13.2 R 13.2 13.3 R 13.9 R 14.1 R 13.4 R  HCT 39.6 38.8 R 39.7 40.4 R 41.6 R 41.8 R 40.5 R  MCV 90.5 88.2 R 88.5 88 R 88 R 87 R 87.1 R  MCHC 32.8 34.0 R 33.2 32.9 R 33.4 R 33.7 R 33.1 R  RDW 14.1 12.3 R 13.9 12.8 R 13.0 R 14.7 R   Platelets 237.0            TFTs - 11/06/23 Serum TSH 0.47 Serum free T4 1.1 Serum T3 3.6  Prior Thyroid  FNA: 04/03/2018 - Right mid, nodule #3, 1.9 cm - [facility]   04/17/2018 - Repeat bx: Right mid, nodule #3, 1.9 cm - [facility]  06/09/2021 - Left inferior, nodule #6, 2.6 cm - [facility]  08/02/2021 - Repeat bx: Left inferior, nodule #6, 2.6 cm - [facility]  08/17/2021 - Repeat bx: Left inferior, nodule #6, 2.6 cm - [facility]; Afirma with benign findings  09/12/2022 - Right mid, nodule #1, 2.7 cm - [facility]    Assessment and Plan: 53 year old female with a history of symptomatic multinodular thyroid .  Her largest nodule is on the  left, #5, with a calculated volume of approximately 30 g.  Thankfully this nodule has a significant cystic component which portends more significant volume reduction after RFA.    We discussed risks and benefits of radiofrequency thyroid  ablation to most commonly include discomfort/pain (2.6%), followed by less commonly (all 1.0% or less) voice change, nodule rupture, infection, hypothyroidism, brachial plexus injury, hematoma, vomiting, and skin burn.   Plan for ultrasound guided radiofrequency ablation of right thyroid  nodule #5 (5.0 cm) with local anesthesia at [facility]  No additional biopsy is needed given TIRADS 2 sonographic characteristics.  Creasie Doctor, MD Pager: [phone]    I spent a total of  40 Minutes   in virtual video clinical consultation, greater than 50% of which was counseling/coordinating care for multinodular goiter.

## 2024-02-18 ENCOUNTER — Ambulatory Visit: Payer: Self-pay | Admitting: Family Medicine

## 2024-02-18 ENCOUNTER — Ambulatory Visit: Payer: Self-pay | Admitting: *Deleted

## 2024-02-18 NOTE — Telephone Encounter (Signed)
  Chief Complaint: dizziness- fatigue, weakness Symptoms: patient has multiple symptoms- dizziness, weakness, she states she feels heart rate changes, abdominal pan with eating is new Frequency: dizziness - 2 weeks, abdominal pain with eating started over the week end Pertinent Negatives: Patient denies vomiting, diarrhea  Disposition: [] ED /[] Urgent Care (no appt availability in office) / [x] Appointment(In office/virtual)/ []  [facility]/ [] Home Care/ [] Refused Recommended Disposition /[] [facility]/ []  Follow-up with PCP Additional Notes: Patient has been relating her dizziness to allergy- but has never discussed this symptom  Copied from CRM [id number]. Topic: Clinical - Red Word Triage >> Feb 18, 2024  9:13 AM Artemio Larry wrote: Red Word that prompted transfer to Nurse Triage: Patient having dizziness, fatigue and discomfort in stomach when eating Reason for Disposition  [1] MODERATE dizziness (e.g., interferes with normal activities) AND [2] has been evaluated by doctor (or NP/PA) for this  Answer Assessment - Initial Assessment Questions 1. DESCRIPTION: "Describe your dizziness."     lightheaded 2. LIGHTHEADED: "Do you feel lightheaded?" (e.g., somewhat faint, woozy, weak upon standing)     Weak, fatigue in bones 3. VERTIGO: "Do you feel like either you or the room is spinning or tilting?" (i.e. vertigo)     no 4. SEVERITY: "How bad is it?"  "Do you feel like you are going to faint?" "Can you stand and walk?"   - MILD: Feels slightly dizzy, but walking normally.   - MODERATE: Feels unsteady when walking, but not falling; interferes with normal activities (e.g., school, work).   - SEVERE: Unable to walk without falling, or requires assistance to walk without falling; feels like passing out now.      mild 5. ONSET:  "When did the dizziness begin?"     2 weeks 6. AGGRAVATING FACTORS: "Does anything make it worse?" (e.g., standing, change in head position)      allergies 7. HEART RATE: "Can you tell me your heart rate?" "How many beats in 15 seconds?"  (Note: not all patients can do this)       Patient states she feels her heart rate speeds/slows- with abdominal pain- started Friday 8. CAUSE: "What do you think is causing the dizziness?"     unsure 9. RECURRENT SYMPTOM: "Have you had dizziness before?" If Yes, ask: "When was the last time?" "What happened that time?"     With severe allergy 10. OTHER SYMPTOMS: "Do you have any other symptoms?" (e.g., fever, chest pain, vomiting, diarrhea, bleeding)       Abdominal, heart rate changing fatigue, extreme tiredness  Protocols used: Dizziness - Lightheadedness-A-AH

## 2024-02-20 ENCOUNTER — Ambulatory Visit: Payer: Self-pay | Admitting: Family Medicine

## 2024-03-03 ENCOUNTER — Ambulatory Visit: Payer: Self-pay | Admitting: Family Medicine

## 2024-03-03 ENCOUNTER — Ambulatory Visit (INDEPENDENT_AMBULATORY_CARE_PROVIDER_SITE_OTHER): Payer: Self-pay | Admitting: Family Medicine

## 2024-03-03 ENCOUNTER — Encounter: Payer: Self-pay | Admitting: Family Medicine

## 2024-03-03 VITALS — BP 112/72 | HR 79 | Temp 97.7°F | Resp 18 | Ht 63.0 in | Wt 168.0 lb

## 2024-03-03 DIAGNOSIS — E559 Vitamin D deficiency, unspecified: Secondary | ICD-10-CM | POA: Diagnosis not present

## 2024-03-03 DIAGNOSIS — R7303 Prediabetes: Secondary | ICD-10-CM | POA: Diagnosis not present

## 2024-03-03 DIAGNOSIS — R002 Palpitations: Secondary | ICD-10-CM

## 2024-03-03 DIAGNOSIS — E538 Deficiency of other specified B group vitamins: Secondary | ICD-10-CM | POA: Diagnosis not present

## 2024-03-03 DIAGNOSIS — K219 Gastro-esophageal reflux disease without esophagitis: Secondary | ICD-10-CM | POA: Diagnosis not present

## 2024-03-03 DIAGNOSIS — Z9109 Other allergy status, other than to drugs and biological substances: Secondary | ICD-10-CM

## 2024-03-03 LAB — CBC WITH DIFFERENTIAL/PLATELET
Basophils Absolute: 0 10*3/uL (ref 0.0–0.1)
Basophils Relative: 0.8 % (ref 0.0–3.0)
Eosinophils Absolute: 0.3 10*3/uL (ref 0.0–0.7)
Eosinophils Relative: 5.8 % — ABNORMAL HIGH (ref 0.0–5.0)
HCT: 39.7 % (ref 36.0–46.0)
Hemoglobin: 13.3 g/dL (ref 12.0–15.0)
Lymphocytes Relative: 43.9 % (ref 12.0–46.0)
Lymphs Abs: 1.9 10*3/uL (ref 0.7–4.0)
MCHC: 33.4 g/dL (ref 30.0–36.0)
MCV: 88.1 fl (ref 78.0–100.0)
Monocytes Absolute: 0.3 10*3/uL (ref 0.1–1.0)
Monocytes Relative: 7.7 % (ref 3.0–12.0)
Neutro Abs: 1.8 10*3/uL (ref 1.4–7.7)
Neutrophils Relative %: 41.8 % — ABNORMAL LOW (ref 43.0–77.0)
Platelets: 219 10*3/uL (ref 150.0–400.0)
RBC: 4.51 Mil/uL (ref 3.87–5.11)
RDW: 13.5 % (ref 11.5–15.5)
WBC: 4.4 10*3/uL (ref 4.0–10.5)

## 2024-03-03 LAB — COMPREHENSIVE METABOLIC PANEL WITH GFR
ALT: 23 U/L (ref 0–35)
AST: 17 U/L (ref 0–37)
Albumin: 4.3 g/dL (ref 3.5–5.2)
Alkaline Phosphatase: 72 U/L (ref 39–117)
BUN: 16 mg/dL (ref 6–23)
CO2: 26 meq/L (ref 19–32)
Calcium: 9.4 mg/dL (ref 8.4–10.5)
Chloride: 104 meq/L (ref 96–112)
Creatinine, Ser: 0.74 mg/dL (ref 0.40–1.20)
GFR: 93.22 mL/min (ref >60.00)
Glucose, Bld: 90 mg/dL (ref 70–99)
Potassium: 3.8 meq/L (ref 3.5–5.1)
Sodium: 139 meq/L (ref 135–145)
Total Bilirubin: 0.5 mg/dL (ref 0.2–1.2)
Total Protein: 7.5 g/dL (ref 6.0–8.3)

## 2024-03-03 LAB — HEMOGLOBIN A1C: Hgb A1c MFr Bld: 5.9 % (ref 4.6–6.5)

## 2024-03-03 LAB — VITAMIN B12: Vitamin B-12: 242 pg/mL (ref 211–911)

## 2024-03-03 LAB — VITAMIN D 25 HYDROXY (VIT D DEFICIENCY, FRACTURES): VITD: 42.74 ng/mL (ref 30.00–100.00)

## 2024-03-03 MED ORDER — OMEPRAZOLE 20 MG PO CPDR: 20.0000 mg | DELAYED_RELEASE_CAPSULE | Freq: Every day | ORAL | 2 refills | Status: AC

## 2024-03-03 MED ORDER — LEVOCETIRIZINE DIHYDROCHLORIDE 5 MG PO TABS: 5.0000 mg | ORAL_TABLET | Freq: Every evening | ORAL | 2 refills | Status: AC

## 2024-03-03 MED ORDER — FLUTICASONE PROPIONATE 50 MCG/ACT NA SUSP: 1.0000 | Freq: Every day | NASAL | 2 refills | Status: AC

## 2024-03-03 NOTE — Progress Notes (Signed)
 Assessment & Plan:  1. Gastroesophageal reflux disease, unspecified whether esophagitis present (Primary) Education provided on GERD. Discussed this is likely contributing to her rapid heart rate and fatigue. Resuming omeprazole  which she has taken in the past.  - omeprazole  (PRILOSEC) 20 MG capsule; Take 1 capsule (20 mg total) by mouth daily.  Dispense: 30 capsule; Refill: 2  2. Environmental allergies Uncontrolled. Continue Flonase  daily. Start Xyzal daily.  - levocetirizine (XYZAL) 5 MG tablet; Take 1 tablet (5 mg total) by mouth every evening.  Dispense: 30 tablet; Refill: 2 - fluticasone  (FLONASE ) 50 MCG/ACT nasal spray; Place 1 spray into both nostrils daily.  Dispense: 11.1 mL; Refill: 2  3. Heart palpitations Normal EKG. Labs to assess for other causes than GERD. - EKG 12-Lead - CBC with Differential/Platelet - Comprehensive metabolic panel with GFR  4. Prediabetes - Hemoglobin A1c  5. Vitamin D  deficiency - VITAMIN D  25 Hydroxy (Vit-D Deficiency, Fractures)  6. B12 deficiency - Vitamin B12   Follow up plan: Return in about 2 months (around 05/03/2024) for chronic follow-up with PCP.  Hershel Los, MSN, APRN, FNP-C  Subjective:  HPI: Becky Cruz is a 53 y.o. female presenting on 03/03/2024 for Dizziness (Feeling dizzy when looking to the right or the left. Rapid heart rate. Extreme fatigue. All symptoms started 10 days ago. )  Patient reports 10 days ago she started experiencing dizziness, rapid heart reate, and fatigue. The dizziness is present at all times and worsens when she turns her head to the left or right. She has tried drinking more water, but that has not improved the dizziness. It does not worsen when rolling over in the bed. She feels it may be related to her allergies. She is using Flonase  daily, but not an antihistamine. In the past she has been diagnosed with vertigo, but states this feels different than previously.   Rapid heart rate and fatigue occur  after eating. States it feels like her heart is pounding for a little bit an then it stops. It doesn't feel irregular. Denies any abdominal pain or cheat pain, but does report experiencing acid reflux for which she occasionally takes Famotidine . States Famotidine  has been somewhat helpful over the weekend. From a fatigue perspective, she states she just wants to lay down, not think, and not talk. Denies any concerns with depression.      03/03/2024    8:04 AM 05/11/2023    8:20 AM 09/13/2022    9:18 AM  Depression screen PHQ 2/9  Decreased Interest 0 0 2  Down, Depressed, Hopeless 0 0 0  PHQ - 2 Score 0 0 2  Altered sleeping   0  Tired, decreased energy   2  Change in appetite   0  Feeling bad or failure about yourself    0  Trouble concentrating   0  Moving slowly or fidgety/restless   0  Suicidal thoughts   0  PHQ-9 Score   4  Difficult doing work/chores   Somewhat difficult     ROS: Negative unless specifically indicated above in HPI.   Relevant past medical history reviewed and updated as indicated.   Allergies and medications reviewed and updated.   Current Outpatient Medications:    acetaminophen  (TYLENOL ) 500 MG tablet, Take 500 mg by mouth every 6 (six) hours as needed for moderate pain., Disp: , Rfl:    cetirizine (ZYRTEC) 5 MG tablet, Take 5 mg by mouth daily., Disp: , Rfl:    Cholecalciferol (VITAMIN D )  50 MCG (2000 UT) tablet, Take 4,000 Units by mouth every other day., Disp: , Rfl:    fluticasone  (FLONASE ) 50 MCG/ACT nasal spray, Place 1 spray into both nostrils daily. (Patient taking differently: Place 1 spray into both nostrils daily as needed for allergies.), Disp: 11.1 mL, Rfl: 2   Multiple Vitamin (MULTIVITAMIN WITH MINERALS) TABS tablet, Take 1 tablet by mouth daily., Disp: , Rfl:   No Known Allergies  Objective:   BP 112/72   Pulse 79   Temp 97.7 F (36.5 C)   Resp 18   Ht 5\' 3"  (1.6 m)   Wt 168 lb (76.2 kg)   LMP 11/25/2012 Comment: stopped breast  feeding Nov2014  SpO2 98%   BMI 29.76 kg/m    Physical Exam Vitals reviewed.  Constitutional:      General: She is not in acute distress.    Appearance: Normal appearance. She is not ill-appearing, toxic-appearing or diaphoretic.  HENT:     Head: Normocephalic and atraumatic.     Right Ear: Tympanic membrane, ear canal and external ear normal. There is no impacted cerumen.     Left Ear: Tympanic membrane, ear canal and external ear normal. There is no impacted cerumen.     Nose: Nose normal. No congestion or rhinorrhea.     Right Sinus: No maxillary sinus tenderness or frontal sinus tenderness.     Left Sinus: No maxillary sinus tenderness or frontal sinus tenderness.     Mouth/Throat:     Mouth: Mucous membranes are moist.     Pharynx: Oropharynx is clear. No oropharyngeal exudate or posterior oropharyngeal erythema.  Eyes:     General: No scleral icterus.       Right eye: No discharge.        Left eye: No discharge.     Conjunctiva/sclera: Conjunctivae normal.  Cardiovascular:     Rate and Rhythm: Normal rate and regular rhythm.     Heart sounds: Normal heart sounds. No murmur heard.    No friction rub. No gallop.  Pulmonary:     Effort: Pulmonary effort is normal. No respiratory distress.     Breath sounds: Normal breath sounds. No stridor. No wheezing, rhonchi or rales.  Musculoskeletal:        General: Normal range of motion.     Cervical back: Normal range of motion.  Lymphadenopathy:     Cervical: No cervical adenopathy.  Skin:    General: Skin is warm and dry.     Capillary Refill: Capillary refill takes less than 2 seconds.  Neurological:     General: No focal deficit present.     Mental Status: She is alert and oriented to person, place, and time. Mental status is at baseline.  Psychiatric:        Mood and Affect: Mood normal.        Behavior: Behavior normal.        Thought Content: Thought content normal.        Judgment: Judgment normal.     EKG: normal  EKG, normal sinus rhythm, HR 71.

## 2024-05-22 ENCOUNTER — Encounter: Payer: Self-pay | Admitting: Family Medicine

## 2024-05-22 ENCOUNTER — Ambulatory Visit (INDEPENDENT_AMBULATORY_CARE_PROVIDER_SITE_OTHER): Admitting: Family Medicine

## 2024-05-22 ENCOUNTER — Ambulatory Visit: Payer: Self-pay | Admitting: Family Medicine

## 2024-05-22 VITALS — BP 132/84 | HR 80 | Temp 97.8°F | Ht 63.0 in | Wt 167.0 lb

## 2024-05-22 DIAGNOSIS — K219 Gastro-esophageal reflux disease without esophagitis: Secondary | ICD-10-CM

## 2024-05-22 DIAGNOSIS — R7303 Prediabetes: Secondary | ICD-10-CM

## 2024-05-22 DIAGNOSIS — E7800 Pure hypercholesterolemia, unspecified: Secondary | ICD-10-CM | POA: Diagnosis not present

## 2024-05-22 DIAGNOSIS — K760 Fatty (change of) liver, not elsewhere classified: Secondary | ICD-10-CM

## 2024-05-22 DIAGNOSIS — Z0001 Encounter for general adult medical examination with abnormal findings: Secondary | ICD-10-CM

## 2024-05-22 DIAGNOSIS — Z23 Encounter for immunization: Secondary | ICD-10-CM | POA: Diagnosis not present

## 2024-05-22 DIAGNOSIS — E538 Deficiency of other specified B group vitamins: Secondary | ICD-10-CM

## 2024-05-22 DIAGNOSIS — H539 Unspecified visual disturbance: Secondary | ICD-10-CM

## 2024-05-22 LAB — CBC WITH DIFFERENTIAL/PLATELET
Basophils Absolute: 0 K/uL (ref 0.0–0.1)
Basophils Relative: 0.9 % (ref 0.0–3.0)
Eosinophils Absolute: 0.4 K/uL (ref 0.0–0.7)
Eosinophils Relative: 8.8 % — ABNORMAL HIGH (ref 0.0–5.0)
HCT: 40.9 % (ref 36.0–46.0)
Hemoglobin: 13.4 g/dL (ref 12.0–15.0)
Lymphocytes Relative: 36.8 % (ref 12.0–46.0)
Lymphs Abs: 1.5 K/uL (ref 0.7–4.0)
MCHC: 32.9 g/dL (ref 30.0–36.0)
MCV: 89 fl (ref 78.0–100.0)
Monocytes Absolute: 0.4 K/uL (ref 0.1–1.0)
Monocytes Relative: 8.6 % (ref 3.0–12.0)
Neutro Abs: 1.9 K/uL (ref 1.4–7.7)
Neutrophils Relative %: 44.9 % (ref 43.0–77.0)
Platelets: 231 K/uL (ref 150.0–400.0)
RBC: 4.6 Mil/uL (ref 3.87–5.11)
RDW: 13.8 % (ref 11.5–15.5)
WBC: 4.2 K/uL (ref 4.0–10.5)

## 2024-05-22 LAB — LIPID PANEL
Cholesterol: 182 mg/dL (ref 0–200)
HDL: 38.9 mg/dL — ABNORMAL LOW (ref >39.00)
LDL Cholesterol: 111 mg/dL — ABNORMAL HIGH (ref 0–99)
NonHDL: 143.34
Total CHOL/HDL Ratio: 5
Triglycerides: 161 mg/dL — ABNORMAL HIGH (ref 0.0–149.0)
VLDL: 32.2 mg/dL (ref 0.0–40.0)

## 2024-05-22 LAB — BASIC METABOLIC PANEL WITH GFR
BUN: 13 mg/dL (ref 6–23)
CO2: 27 meq/L (ref 19–32)
Calcium: 9.3 mg/dL (ref 8.4–10.5)
Chloride: 104 meq/L (ref 96–112)
Creatinine, Ser: 0.72 mg/dL (ref 0.40–1.20)
GFR: 96.19 mL/min (ref >60.00)
Glucose, Bld: 91 mg/dL (ref 70–99)
Potassium: 4.1 meq/L (ref 3.5–5.1)
Sodium: 139 meq/L (ref 135–145)

## 2024-05-22 LAB — TSH: TSH: 0.68 u[IU]/mL (ref 0.35–5.50)

## 2024-05-22 LAB — HEPATIC FUNCTION PANEL
ALT: 21 U/L (ref 0–35)
AST: 18 U/L (ref 0–37)
Albumin: 4.2 g/dL (ref 3.5–5.2)
Alkaline Phosphatase: 66 U/L (ref 39–117)
Bilirubin, Direct: 0.1 mg/dL (ref 0.0–0.3)
Total Bilirubin: 0.6 mg/dL (ref 0.2–1.2)
Total Protein: 7.4 g/dL (ref 6.0–8.3)

## 2024-05-22 LAB — VITAMIN B12: Vitamin B-12: 216 pg/mL (ref 211–911)

## 2024-05-22 LAB — T4, FREE: Free T4: 0.68 ng/dL (ref 0.60–1.60)

## 2024-05-22 NOTE — Progress Notes (Signed)
 Complete physical exam  Patient: Becky Cruz   DOB: 1971-02-26   53 y.o. Female  MRN: [id number]  Subjective:    Chief Complaint  Patient presents with   Annual Exam    Lower right side abdominal sharp pain   She is here for a complete physical exam.   Discussed the use of AI scribe software for clinical note transcription with the patient, who gave verbal consent to proceed.  History of Present Illness Becky Cruz is a 53 year old female who presents for an annual physical exam and preventive healthcare visit.  Pelvic pain and menstrual irregularity - Intermittent sharp pain in the right lower pelvic region - Amenorrhea with absence of recent menstrual periods - Referral to OBGYN previously made but not completed  Gallstones and hepatic health - History of gallstones - No current upper abdominal pain after eating - Concern regarding hepatic health due to fatty liver - Practicing dietary caution with fatty foods - Liver function tests were normal in June  Ocular discomfort - Intermittent eye discomfort affecting reading - Does not wear corrective lenses - No recent eye examination  Gynecologic and cancer screening - No recent Pap smear - No established OBGYN - Mammogram performed last year; wishes to schedule repeat screening - Colonoscopy performed a few years ago  Immunization status - No recent hepatitis B or pneumococcal vaccination - Uncertain about overall vaccination history   Referred to GYN and she did not return call   Health Maintenance  Topic Date Due   DTaP/Tdap/Td vaccine (2 - Td or Tdap) 11/30/2020   Pneumococcal Vaccine for age over 60 (1 of 1 - PCV) Never done   Zoster (Shingles) Vaccine (1 of 2) Never done   Pap with HPV screening  11/19/2022   Flu Shot  05/02/2024   COVID-19 Vaccine (1 - 2024-25 season) 06/07/2024*   Hepatitis B Vaccine (2 of 2 - CpG 2-dose series) 06/19/2024   Mammogram  08/08/2024   Colon Cancer Screening  04/16/2030    Hepatitis C Screening  Completed   HIV Screening  Completed   HPV Vaccine  Aged Out   Meningitis B Vaccine  Aged Out  *Topic was postponed. The date shown is not the original due date.    Wears seatbelt always, smoke detectors in home and functioning, does not text while driving, feels safe in home environment.  Depression screening:    03/03/2024    8:04 AM 05/11/2023    8:20 AM 09/13/2022    9:18 AM  Depression screen PHQ 2/9  Decreased Interest 0 0 2  Down, Depressed, Hopeless 0 0 0  PHQ - 2 Score 0 0 2  Altered sleeping   0  Tired, decreased energy   2  Change in appetite   0  Feeling bad or failure about yourself    0  Trouble concentrating   0  Moving slowly or fidgety/restless   0  Suicidal thoughts   0  PHQ-9 Score   4  Difficult doing work/chores   Somewhat difficult   Anxiety Screening:    03/03/2024    8:04 AM 01/28/2020    1:11 PM 12/05/2019    3:26 PM  GAD 7 : Generalized Anxiety Score  Nervous, Anxious, on Edge 0 0 0  Control/stop worrying 0 0 0  Worry too much - different things 0 0 0  Trouble relaxing 0 0 0  Restless 0 0 0  Easily annoyed or irritable 0 0 0  Afraid - awful might happen 0 0 0  Total GAD 7 Score 0 0 0  Anxiety Difficulty  Not difficult at all Not difficult at all    Vision:Not within last year  and Dental: No current dental problems  Patient Active Problem List   Diagnosis Date Noted   Plantar fasciitis 05/11/2023   Low HDL (under 40) 05/11/2023   Hematuria 01/17/2023   ANA positive 09/17/2022   B12 deficiency 09/17/2022   Myalgia 09/17/2022   NAFLD (nonalcoholic fatty liver disease) 89/72/7976   Cervical cancer screening 07/27/2022   Tinnitus 07/27/2022   Dehydration 12/13/2021   Dysphagia 07/14/2021   Dysuria 07/14/2021   Back pain 07/14/2021   Elevated alanine aminotransferase (ALT) level 11/25/2020   Benign positional vertigo 11/23/2020   Fatigue 07/01/2020   Alopecia 01/28/2020   Multiple thyroid  nodules 12/08/2019    Vitamin D  deficiency 12/08/2019   Elevated BP without diagnosis of hypertension 12/08/2019   Gastroesophageal reflux disease 03/15/2018   Prediabetes 02/11/2018   Environmental allergies 06/12/2017   Ventricular premature complex 12/08/2016   Dyslipidemia 02/25/2016   Secondary amenorrhea 02/06/2014   Past Medical History:  Diagnosis Date   ANA positive 09/17/2022   Anemia    Constipation 02/25/2016   Dysrhythmia    Palpitations 12/08/2016   Pregnancy induced hypertension    Past Surgical History:  Procedure Laterality Date   IR RADIOLOGIST EVAL & MGMT  01/28/2024   WISDOM TOOTH EXTRACTION     Social History   Tobacco Use   Smoking status: Never    Passive exposure: Never   Smokeless tobacco: Never  Vaping Use   Vaping status: Never Used  Substance Use Topics   Alcohol use: No   Drug use: No      Patient Care Team: Lendia Boby CROME, NP-C as PCP - General (Family Medicine) Clark-Burning, Delon, PA-C (Inactive) (Dermatology)   Outpatient Medications Prior to Visit  Medication Sig   acetaminophen  (TYLENOL ) 500 MG tablet Take 500 mg by mouth every 6 (six) hours as needed for moderate pain.   Cholecalciferol (VITAMIN D ) 50 MCG (2000 UT) tablet Take 4,000 Units by mouth every other day.   fluticasone  (FLONASE ) 50 MCG/ACT nasal spray Place 1 spray into both nostrils daily.   levocetirizine (XYZAL ) 5 MG tablet Take 1 tablet (5 mg total) by mouth every evening.   Multiple Vitamin (MULTIVITAMIN WITH MINERALS) TABS tablet Take 1 tablet by mouth daily.   omeprazole  (PRILOSEC) 20 MG capsule Take 1 capsule (20 mg total) by mouth daily.   No facility-administered medications prior to visit.    Review of Systems  Constitutional:  Negative for chills and fever.  Eyes:  Negative for blurred vision, double vision and photophobia.  Respiratory:  Negative for cough and shortness of breath.   Cardiovascular:  Negative for chest pain, palpitations and leg swelling.   Gastrointestinal:  Negative for abdominal pain, constipation, diarrhea, nausea and vomiting.  Genitourinary:  Negative for dysuria, frequency and urgency.  Neurological:  Negative for dizziness, focal weakness and headaches.       Objective:    BP 132/84   Pulse 80   Temp 97.8 F (36.6 C) (Temporal)   Ht 5' 3 (1.6 m)   Wt 167 lb (75.8 kg)   LMP 11/25/2012 Comment: stopped breast feeding Nov2014  SpO2 98%   BMI 29.58 kg/m  BP Readings from Last 3 Encounters:  05/22/24 132/84  03/03/24 112/72  11/06/23 110/70   Wt Readings from Last 3 Encounters:  05/22/24 167 lb (75.8 kg)  03/03/24 168 lb (76.2 kg)  11/06/23 162 lb (73.5 kg)    Physical Exam Constitutional:      General: She is not in acute distress.    Appearance: She is not ill-appearing.  HENT:     Right Ear: Tympanic membrane, ear canal and external ear normal.     Left Ear: Tympanic membrane, ear canal and external ear normal.     Nose: Nose normal.     Mouth/Throat:     Mouth: Mucous membranes are moist.     Pharynx: Oropharynx is clear.  Eyes:     Extraocular Movements: Extraocular movements intact.     Conjunctiva/sclera: Conjunctivae normal.     Pupils: Pupils are equal, round, and reactive to light.  Neck:     Thyroid : No thyroid  mass, thyromegaly or thyroid  tenderness.  Cardiovascular:     Rate and Rhythm: Normal rate and regular rhythm.     Pulses: Normal pulses.     Heart sounds: Normal heart sounds.  Pulmonary:     Effort: Pulmonary effort is normal.     Breath sounds: Normal breath sounds.  Abdominal:     General: Bowel sounds are normal.     Palpations: Abdomen is soft.     Tenderness: There is no abdominal tenderness. There is no right CVA tenderness, left CVA tenderness, guarding or rebound.  Musculoskeletal:        General: Normal range of motion.     Cervical back: Normal range of motion and neck supple. No tenderness.     Right lower leg: No edema.     Left lower leg: No edema.   Lymphadenopathy:     Cervical: No cervical adenopathy.  Skin:    General: Skin is warm and dry.     Findings: No lesion or rash.  Neurological:     General: No focal deficit present.     Mental Status: She is alert and oriented to person, place, and time.     Cranial Nerves: No cranial nerve deficit.     Sensory: No sensory deficit.     Motor: No weakness.     Gait: Gait normal.  Psychiatric:        Mood and Affect: Mood normal.        Behavior: Behavior normal.        Thought Content: Thought content normal.      Results for orders placed or performed in visit on 05/22/24  T4, free  Result Value Ref Range   Free T4 0.68 0.60 - 1.60 ng/dL  TSH  Result Value Ref Range   TSH 0.68 0.35 - 5.50 uIU/mL  Hepatic function panel  Result Value Ref Range   Total Bilirubin 0.6 0.2 - 1.2 mg/dL   Bilirubin, Direct 0.1 0.0 - 0.3 mg/dL   Alkaline Phosphatase 66 39 - 117 U/L   AST 18 0 - 37 U/L   ALT 21 0 - 35 U/L   Total Protein 7.4 6.0 - 8.3 g/dL   Albumin 4.2 3.5 - 5.2 g/dL  Basic metabolic panel with GFR  Result Value Ref Range   Sodium 139 135 - 145 mEq/L   Potassium 4.1 3.5 - 5.1 mEq/L   Chloride 104 96 - 112 mEq/L   CO2 27 19 - 32 mEq/L   Glucose, Bld 91 70 - 99 mg/dL   BUN 13 6 - 23 mg/dL   Creatinine, Ser 9.27 0.40 - 1.20 mg/dL   GFR 03.80 >  60.00 mL/min   Calcium 9.3 8.4 - 10.5 mg/dL  Vitamin A87  Result Value Ref Range   Vitamin B-12 216 211 - 911 pg/mL  Lipid panel  Result Value Ref Range   Cholesterol 182 0 - 200 mg/dL   Triglycerides 838.9 (H) 0.0 - 149.0 mg/dL   HDL 61.09 (L) >60.99 mg/dL   VLDL 67.7 0.0 - 59.9 mg/dL   LDL Cholesterol 888 (H) 0 - 99 mg/dL   Total CHOL/HDL Ratio 5    NonHDL 143.34   CBC with Differential/Platelet  Result Value Ref Range   WBC 4.2 4.0 - 10.5 K/uL   RBC 4.60 3.87 - 5.11 Mil/uL   Hemoglobin 13.4 12.0 - 15.0 g/dL   HCT 59.0 63.9 - 53.9 %   MCV 89.0 78.0 - 100.0 fl   MCHC 32.9 30.0 - 36.0 g/dL   RDW 86.1 88.4 - 84.4 %    Platelets 231.0 150.0 - 400.0 K/uL   Neutrophils Relative % 44.9 43.0 - 77.0 %   Lymphocytes Relative 36.8 12.0 - 46.0 %   Monocytes Relative 8.6 3.0 - 12.0 %   Eosinophils Relative 8.8 (H) 0.0 - 5.0 %   Basophils Relative 0.9 0.0 - 3.0 %   Neutro Abs 1.9 1.4 - 7.7 K/uL   Lymphs Abs 1.5 0.7 - 4.0 K/uL   Monocytes Absolute 0.4 0.1 - 1.0 K/uL   Eosinophils Absolute 0.4 0.0 - 0.7 K/uL   Basophils Absolute 0.0 0.0 - 0.1 K/uL      Assessment & Plan:    Routine Health Maintenance and Physical Exam Problem List Items Addressed This Visit     B12 deficiency   Relevant Orders   Vitamin B12 (Completed)   Gastroesophageal reflux disease   NAFLD (nonalcoholic fatty liver disease)   Relevant Orders   CBC with Differential/Platelet (Completed)   Basic metabolic panel with GFR (Completed)   Hepatic function panel (Completed)   Prediabetes   Relevant Orders   CBC with Differential/Platelet (Completed)   Basic metabolic panel with GFR (Completed)   Hepatic function panel (Completed)   TSH (Completed)   T4, free (Completed)   Other Visit Diagnoses       Encounter for general adult medical examination with abnormal findings    -  Primary     Pure hypercholesterolemia       Relevant Orders   Lipid panel (Completed)     Vision changes       Relevant Orders   Ambulatory referral to Ophthalmology     Immunization due       Relevant Orders   Heplisav-B  (HepB-CPG) Vaccine (Completed)       Assessment and Plan Assessment & Plan Adult Wellness Visit Annual physical exam conducted. Discussed preventive healthcare measures including vaccinations and screenings. She agreed to receive hepatitis B and Tdap vaccines after discussing their benefits and potential side effects.  - Administer hepatitis B vaccine - Tdap and Shingrix recommended at pharmacy  - Provide information for scheduling mammogram and she will call to schedule  - Provide information for scheduling OBGYN appointment - Check  insurance coverage for shingles vaccine - Order blood work  Nonalcoholic fatty liver disease (NAFLD) Liver function tests were normal in June. She reports recent dietary indiscretions but plans to resume a healthy diet. - Recheck liver function tests with current labs  Gastroesophageal reflux disease (GERD) She reports taking Omeprazole  for acid reflux with previous benefit.  Right lower quadrant pelvic pain Reports chronic right lower quadrant pelvic  pain. Previous referral to OBGYN was not completed. No acute distress during examination. Abdominal exam normal.  - Provide information for scheduling OBGYN appointment  Prediabetes -last A1c 5.9% -reduce sugar and carbohydrates in diet.       Return in about 1 year (around 05/22/2025).     Boby Mackintosh, NP-C

## 2024-05-22 NOTE — Patient Instructions (Addendum)
  Please call to schedule:  [facility]  [phone] [facility] [phone]   I referred you to the eye doctor and they will call you to schedule   Records indicate that you are overdue for the Tdap and shingles vaccines.  You can get these at your pharmacy.  You should check with your insurance regarding coverage for these vaccines prior to getting them.  We will be in touch with your lab results

## 2024-06-05 ENCOUNTER — Telehealth: Payer: Self-pay

## 2024-06-05 DIAGNOSIS — Z124 Encounter for screening for malignant neoplasm of cervix: Secondary | ICD-10-CM

## 2024-06-05 NOTE — Addendum Note (Signed)
 Addended by: Balbina Depace E on: 06/05/2024 11:20 AM   Modules accepted: Orders

## 2024-06-05 NOTE — Telephone Encounter (Signed)
 Called pt and informed we placed new referral for her and if she doesn't hear anything in the next week to call us  so we can check on it for her.   Pt was also wanting an update on eye dr referral as she states her eyes are starting to bother her more, l advised pt l will speak w our referral coordinator and check on this for her.   Pt scheduled appt for tomorrow as she states the bottom of her foot has been hurting her really bad when she walks. Has not taken any meds for it.

## 2024-06-05 NOTE — Telephone Encounter (Signed)
 Copied from CRM [id number]. Topic: General - Other >> Jun 04, 2024  3:57 PM Roselie BROCKS wrote: Reason for CRM: Patient needs a return call, referral sent to gynocologist , that doctor does not accept new patients

## 2024-06-06 ENCOUNTER — Ambulatory Visit (INDEPENDENT_AMBULATORY_CARE_PROVIDER_SITE_OTHER): Admitting: Family Medicine

## 2024-06-06 ENCOUNTER — Encounter: Payer: Self-pay | Admitting: Family Medicine

## 2024-06-06 VITALS — BP 122/82 | HR 85 | Temp 97.6°F | Ht 63.0 in | Wt 166.0 lb

## 2024-06-06 DIAGNOSIS — M722 Plantar fascial fibromatosis: Secondary | ICD-10-CM

## 2024-06-06 MED ORDER — MELOXICAM 15 MG PO TABS
15.0000 mg | ORAL_TABLET | Freq: Every day | ORAL | 0 refills | Status: DC
Start: 2024-06-06 — End: 2024-08-04

## 2024-06-06 NOTE — Progress Notes (Signed)
 Subjective:     Patient ID: Becky Cruz, female    DOB: 1971/01/13, 53 y.o.   MRN: [id number]  Chief Complaint  Patient presents with   Foot Pain    bottom of her left foot has been hurting her really bad when she walks. Has not taken any meds for it.    Foot Pain    Discussed the use of AI scribe software for clinical note transcription with the patient, who gave verbal consent to proceed.  History of Present Illness Becky Cruz is a 53 year old female who presents with left foot pain.  Left plantar foot pain - Persistent pain localized to the plantar aspect of the left foot - Pain is most severe in the morning upon waking, making it difficult to bear weight initially - Prolonged standing exacerbates the pain - Pain does not radiate - No associated symptoms such as swelling, redness, numbness, or tingling - No prior diagnostic studies or specific treatments for this issue  Impact on daily activities - Avoids walking barefoot around the house due to pain - Finds New Balance shoes helpful for arch support      Health Maintenance Due  Topic Date Due   DTaP/Tdap/Td (2 - Td or Tdap) 11/30/2020   Pneumococcal Vaccine: 50+ Years (1 of 1 - PCV) Never done   Zoster Vaccines- Shingrix (1 of 2) Never done   Cervical Cancer Screening (HPV/Pap Cotest)  11/19/2022   Influenza Vaccine  Never done   COVID-19 Vaccine (1 - 2024-25 season) Never done    Past Medical History:  Diagnosis Date   ANA positive 09/17/2022   Anemia    Constipation 02/25/2016   Dysrhythmia    Palpitations 12/08/2016   Pregnancy induced hypertension     Past Surgical History:  Procedure Laterality Date   IR RADIOLOGIST EVAL & MGMT  01/28/2024   WISDOM TOOTH EXTRACTION      Family History  Problem Relation Age of Onset   Diabetes Mother    Hypertension Mother    Hypertension Sister    Diabetes Sister    Diabetes Maternal Grandmother    Anesthesia problems Neg Hx    Hypotension Neg Hx     Malignant hyperthermia Neg Hx    Pseudochol deficiency Neg Hx     Social History   Socioeconomic History   Marital status: Married    Spouse name: Not on file   Number of children: Not on file   Years of education: Not on file   Highest education level: Not on file  Occupational History   Not on file  Tobacco Use   Smoking status: Never    Passive exposure: Never   Smokeless tobacco: Never  Vaping Use   Vaping status: Never Used  Substance and Sexual Activity   Alcohol use: No   Drug use: No   Sexual activity: Yes    Birth control/protection: None, Condom  Other Topics Concern   Not on file  Social History Narrative   Originally from [place].   Social Drivers of Corporate investment banker Strain: Not on file  Food Insecurity: Not on file  Transportation Needs: Not on file  Physical Activity: Not on file  Stress: Not on file  Social Connections: Not on file  Intimate Partner Violence: Not on file    Outpatient Medications Prior to Visit  Medication Sig Dispense Refill   acetaminophen  (TYLENOL ) 500 MG tablet Take 500 mg by mouth every 6 (six) hours  as needed for moderate pain.     Cholecalciferol (VITAMIN D ) 50 MCG (2000 UT) tablet Take 4,000 Units by mouth every other day.     fluticasone  (FLONASE ) 50 MCG/ACT nasal spray Place 1 spray into both nostrils daily. 11.1 mL 2   levocetirizine (XYZAL ) 5 MG tablet Take 1 tablet (5 mg total) by mouth every evening. 30 tablet 2   Multiple Vitamin (MULTIVITAMIN WITH MINERALS) TABS tablet Take 1 tablet by mouth daily.     omeprazole  (PRILOSEC) 20 MG capsule Take 1 capsule (20 mg total) by mouth daily. 30 capsule 2   No facility-administered medications prior to visit.    No Known Allergies  ROS Denies fever, chills, shortness of breath, abdominal pain, N/V/D     Objective:    Physical Exam Constitutional:      General: She is not in acute distress.    Appearance: She is not ill-appearing.  Eyes:     Extraocular  Movements: Extraocular movements intact.     Conjunctiva/sclera: Conjunctivae normal.  Cardiovascular:     Rate and Rhythm: Normal rate.  Pulmonary:     Effort: Pulmonary effort is normal.  Musculoskeletal:     Cervical back: Normal range of motion and neck supple.     Right lower leg: No tenderness. No edema.     Left lower leg: No tenderness. No edema.     Left foot: Normal range of motion and normal capillary refill. Tenderness present. Normal pulse.     Comments: Left foot with TTP at medial plantar surface of heel. Pain slightly worse with dorsiflexion of great toe. achilles intact and non tender   Skin:    General: Skin is warm and dry.  Neurological:     General: No focal deficit present.     Mental Status: She is alert and oriented to person, place, and time.  Psychiatric:        Mood and Affect: Mood normal.        Behavior: Behavior normal.        Thought Content: Thought content normal.      BP 122/82   Pulse 85   Temp 97.6 F (36.4 C) (Temporal)   Ht 5' 3 (1.6 m)   Wt 166 lb (75.3 kg)   LMP 11/25/2012 Comment: stopped breast feeding Nov2014  SpO2 99%   BMI 29.41 kg/m  Wt Readings from Last 3 Encounters:  06/06/24 166 lb (75.3 kg)  05/22/24 167 lb (75.8 kg)  03/03/24 168 lb (76.2 kg)       Assessment & Plan:   Problem List Items Addressed This Visit     Plantar fasciitis - Primary    Assessment and Plan Assessment & Plan Left foot plantar fasciitis Chronic left foot plantar fasciitis with pain exacerbated in the morning and after prolonged standing, likely due to tight calf muscles and lack of arch support. Condition can improve with appropriate interventions but may recur if preventive measures are not maintained. - Prescribe meloxicam  for 1-2 weeks for inflammation control. - Advise against walking barefoot and recommend wearing shoes with good arch support at all times. - Instruct on stretching exercises, including calf muscle stretches and towel  stretches before getting out of bed. - Recommend ice massage using a frozen water bottle or a tennis ball to alleviate pain. - Suggest considering splints to wear at night to prevent foot flexion. - Provide information on supportive footwear - Discuss potential referral to orthopedics or podiatry for steroid injection if symptoms  do not improve.     I am having Milley T. Deboer start on meloxicam . I am also having her maintain her multivitamin with minerals, Vitamin D , acetaminophen , omeprazole , levocetirizine, and fluticasone .  Meds ordered this encounter  Medications   meloxicam  (MOBIC ) 15 MG tablet    Sig: Take 1 tablet (15 mg total) by mouth daily.    Dispense:  14 tablet    Refill:  0    Supervising Provider:   ROLLENE NORRIS A [[id number]]

## 2024-06-06 NOTE — Patient Instructions (Signed)
 Plantar Fasciitis: What to Know  Your plantar fascia is a band of thick tissue on the bottom of your foot. It connects your heel bone to the base of your toes. If the fascia gets irritated, it can cause pain in your heel or foot. This is called plantar fasciitis. In some cases, plantar fasciitis can make it hard for you to walk or move. The pain is often worse in the morning after sleeping, or after sitting or lying down for a long time. Pain may also be worse after walking or standing for a long time. What are the causes? Plantar fasciitis may be caused by: Standing for a long time. Wearing shoes that don't have good arch support. Doing high-impact activities. These are things that put stress on your joints. They include: Ballet. Aerobic exercises. These are exercises that make your heart beat faster. Being overweight. Having a way of walking, or gait, that isn't normal. Tight muscles in your calf, which is in the back of your lower leg. High arches in your feet, or flat feet. Starting a new sport or activity. What are the signs or symptoms? The main symptom of plantar fasciitis is heel pain. Your pain may get worse after: You take your first steps after a time of rest. This includes in the morning after you wake up, or after you've been sitting or lying down for a while. Standing still for a long time. Pain may lessen after 30-45 minutes of activity, such as gentle walking. How is this diagnosed? Plantar fasciitis may be diagnosed based on your medical history, your symptoms, and an exam. Your health care provider will check for: A tender spot on the bottom of your foot. A high arch in your foot, or flat feet. Pain when you move your foot. Trouble moving your foot. You may also have tests. These may include: X-rays. Ultrasound. MRI. How is this treated? Treatment depends on how bad your plantar fasciitis is. It may include: RICE therapy. This stands for rest, ice, pressure  (compression), and raising (elevating) the foot. Exercises to stretch your calves and plantar fascia. A night splint. This holds your foot in a stretched, upward position while you sleep. Physical therapy. This can help with symptoms. It can also prevent problems in the future. Shots of a steroid medicine called cortisone. This can help with pain and irritation. Extracorporeal shock wave therapy. This uses electric shocks to stimulate your plantar fascia. If other treatments don't help, you may need to have surgery. Follow these instructions at home: Managing pain, stiffness, and swelling  Use ice, an ice pack, or a frozen bottle of water as told. Place a towel between your skin and the ice. Roll the bottom of your foot over the ice or frozen bottle. Do this for 20 minutes, 2-3 times a day. If your skin turns red, take off the ice right away to prevent skin damage. The risk of damage is higher if you can't feel pain, heat, or cold. Wear shoes that have air-sole or gel-sole cushions. You could also try soft shoe inserts made for plantar fasciitis. Activity Try not to do things that cause pain. Ask what things are safe for you to do. Exercise as told. Try activities that are low impact. This means that they're easier on your joints. They include: Swimming. Water aerobics. Biking. General instructions Take your medicines only as told. Wear a night splint as told. Loosen the splint if your toes tingle, are numb, or turn cold and blue.  Stay at a healthy weight. Work with your provider to lose weight as needed. Contact a health care provider if: Your symptoms don't go away with treatment. You have pain that gets worse. Your pain makes it hard to move or do everyday things. This information is not intended to replace advice given to you by your health care provider. Make sure you discuss any questions you have with your health care provider. Document Revised: 02/19/2023 Document Reviewed:  02/19/2023 Elsevier Patient Education  2024 [employer].

## 2024-07-17 ENCOUNTER — Encounter: Payer: Self-pay | Admitting: Physician Assistant

## 2024-07-17 ENCOUNTER — Ambulatory Visit (INDEPENDENT_AMBULATORY_CARE_PROVIDER_SITE_OTHER): Admitting: Physician Assistant

## 2024-07-17 VITALS — BP 123/83 | HR 81 | Ht 60.0 in | Wt 166.3 lb

## 2024-07-17 DIAGNOSIS — Z124 Encounter for screening for malignant neoplasm of cervix: Secondary | ICD-10-CM | POA: Diagnosis not present

## 2024-07-17 DIAGNOSIS — Z1231 Encounter for screening mammogram for malignant neoplasm of breast: Secondary | ICD-10-CM

## 2024-07-17 DIAGNOSIS — Z01419 Encounter for gynecological examination (general) (routine) without abnormal findings: Secondary | ICD-10-CM

## 2024-07-17 NOTE — Progress Notes (Signed)
 ANNUAL EXAM Patient name: Becky Cruz MRN [id number]  Date of birth: May 31, 1971 Chief Complaint:   New Patient (Initial Visit)  History of Present Illness:   Becky Cruz is a 53 y.o. [phone] female being seen today for a routine annual exam.   Current complaints: None  Patient's last menstrual period was 11/25/2012.  The pregnancy intention screening data noted above was reviewed. Potential methods of contraception were discussed. The patient elected to proceed with No data recorded.   Last pap 11/19/17. Results were: NILM w/ HRHPV negative. H/O abnormal pap: no Last mammogram: 08/08/22. Results were: normal. Family h/o breast cancer: no Last colonoscopy: 04/16/20. Results were: normal. Family h/o colorectal cancer: no STI screening: Politely declines     03/03/2024    8:04 AM 05/11/2023    8:20 AM 09/13/2022    9:18 AM 08/17/2022    3:16 PM 07/26/2022    1:38 PM  Depression screen PHQ 2/9  Decreased Interest 0 0 2 0 0  Down, Depressed, Hopeless 0 0 0 0 0  PHQ - 2 Score 0 0 2 0 0  Altered sleeping   0    Tired, decreased energy   2    Change in appetite   0    Feeling bad or failure about yourself    0    Trouble concentrating   0    Moving slowly or fidgety/restless   0    Suicidal thoughts   0    PHQ-9 Score   4    Difficult doing work/chores   Somewhat difficult          03/03/2024    8:04 AM 01/28/2020    1:11 PM 12/05/2019    3:26 PM  GAD 7 : Generalized Anxiety Score  Nervous, Anxious, on Edge 0 0 0  Control/stop worrying 0 0 0  Worry too much - different things 0 0 0  Trouble relaxing 0 0 0  Restless 0 0 0  Easily annoyed or irritable 0 0 0  Afraid - awful might happen 0 0 0  Total GAD 7 Score 0 0 0  Anxiety Difficulty  Not difficult at all Not difficult at all     Review of Systems:   Pertinent items are noted in HPI Denies any headaches, blurred vision, fatigue, shortness of breath, chest pain, abdominal pain, abnormal vaginal  discharge/itching/odor/irritation, problems with periods, bowel movements, urination, or intercourse unless otherwise stated above. Pertinent History Reviewed:  Reviewed past medical,surgical, social and family history.  Reviewed problem list, medications and allergies. Physical Assessment:   Vitals:   07/17/24 0814  BP: (!) 151/93  Pulse: 74  Weight: 166 lb 4.8 oz (75.4 kg)  Height: 5' (1.524 m)  Body mass index is 32.48 kg/m.        Physical Examination:   General appearance - well appearing, and in no distress  Mental status - alert, oriented to person, place, and time  Psych:  She has a normal mood and affect  Skin - warm and dry, normal color, no suspicious lesions noted  Chest - effort normal, all lung fields clear to auscultation bilaterally  Heart - normal rate and regular rhythm  Neck:  midline trachea, no thyromegaly or nodules  Breasts - breasts appear normal, no suspicious masses, no skin or nipple changes or  axillary nodes  Abdomen - soft, nontender, nondistended, no masses or organomegaly  Pelvic - VULVA: normal appearing vulva with no masses, tenderness or lesions  VAGINA: normal appearing vagina with normal color and discharge, no lesions  CERVIX: normal appearing cervix without discharge or lesions, no CMT  Thin prep pap is done with HR HPV cotesting  UTERUS: uterus is felt to be normal size, shape, consistency and nontender   ADNEXA: No adnexal masses or tenderness noted.  Extremities:  No swelling or varicosities noted  Chaperone present for exam  No results found for this or any previous visit (from the past 24 hours).  Assessment & Plan:   1. Encounter for annual routine gynecological examination (Primary) - Cervical cancer screening: Discussed guidelines. Pap with HPV - Breast Health: Encouraged self breast awareness/SBE. Discussed limits of clinical breast exam for detecting breast cancer. Discussed importance of annual MXR. Rx given for MXR -  Colonoscopy: 2031 - Climacteric/Sexual health: Reviewed typical and atypical symptoms of menopause/peri-menopause. Discussed PMB and to call if any amount of spotting.  - Colonoscopy: up to date- repeat 2031 - F/U 12 months and prn   2. Cervical cancer screening   3. Breast cancer screening by mammogram   No orders of the defined types were placed in this encounter.   Meds: No orders of the defined types were placed in this encounter.   Follow-up: No follow-ups on file.  Denece Shearer E Tecora Eustache, PA-C 07/17/2024 8:21 AM

## 2024-07-17 NOTE — Progress Notes (Signed)
 Pt presents for annual, pt last pap was in 2022 in [place] . pt declines all std testing.  Pt has no questions or concerns at this time.

## 2024-07-22 LAB — CYTOLOGY - PAP: Adequacy: ABNORMAL

## 2024-07-29 ENCOUNTER — Ambulatory Visit: Payer: Self-pay | Admitting: Physician Assistant

## 2024-08-04 ENCOUNTER — Ambulatory Visit (HOSPITAL_COMMUNITY)
Admission: EM | Admit: 2024-08-04 | Discharge: 2024-08-04 | Disposition: A | Discharge status: Home / Self Care | Attending: Emergency Medicine | Admitting: Emergency Medicine

## 2024-08-04 ENCOUNTER — Encounter (HOSPITAL_COMMUNITY): Payer: Self-pay | Admitting: Emergency Medicine

## 2024-08-04 DIAGNOSIS — J029 Acute pharyngitis, unspecified: Secondary | ICD-10-CM

## 2024-08-04 DIAGNOSIS — R42 Dizziness and giddiness: Secondary | ICD-10-CM

## 2024-08-04 DIAGNOSIS — B349 Viral infection, unspecified: Secondary | ICD-10-CM

## 2024-08-04 LAB — POCT RAPID STREP A (OFFICE): Rapid Strep A Screen: NEGATIVE

## 2024-08-04 LAB — POC COVID19/FLU A&B COMBO
Covid Antigen, POC: NEGATIVE
Influenza A Antigen, POC: NEGATIVE
Influenza B Antigen, POC: NEGATIVE

## 2024-08-04 MED ORDER — MECLIZINE HCL 25 MG PO TABS: 25.0000 mg | ORAL_TABLET | Freq: Three times a day (TID) | ORAL | 0 refills | Status: AC | PRN

## 2024-08-04 NOTE — ED Provider Notes (Signed)
 [facility]    CSN: [id number] Arrival date & time: 08/04/24  1221      History   Chief Complaint Chief Complaint  Patient presents with   Sore Throat    HPI Becky Cruz is a 53 y.o. female.   Patient presents with sore throat, intermittent headache, fatigue, body aches, dizziness, and mild nausea that began yesterday.  Patient states that she has had a mild cough, body aches, and chills as well.    Denies known fever, vomiting, diarrhea, abdominal pain, chest pain, and shortness of breath.  Patient reports that some of her children have had similar symptoms over the last week as well.  Patient reports that she did take ibuprofen  last night with some relief of sore throat and headache  The history is provided by the patient and medical records.  Sore Throat    Past Medical History:  Diagnosis Date   ANA positive 09/17/2022   Anemia    Constipation 02/25/2016   Dysrhythmia    Palpitations 12/08/2016   Pregnancy induced hypertension     Patient Active Problem List   Diagnosis Date Noted   Plantar fasciitis 05/11/2023   Low HDL (under 40) 05/11/2023   Hematuria 01/17/2023   ANA positive 09/17/2022   B12 deficiency 09/17/2022   Myalgia 09/17/2022   NAFLD (nonalcoholic fatty liver disease) 89/72/7976   Cervical cancer screening 07/27/2022   Tinnitus 07/27/2022   Dehydration 12/13/2021   Dysphagia 07/14/2021   Dysuria 07/14/2021   Back pain 07/14/2021   Elevated alanine aminotransferase (ALT) level 11/25/2020   Benign positional vertigo 11/23/2020   Fatigue 07/01/2020   Alopecia 01/28/2020   Multiple thyroid  nodules 12/08/2019   Vitamin D  deficiency 12/08/2019   Elevated BP without diagnosis of hypertension 12/08/2019   Gastroesophageal reflux disease 03/15/2018   Prediabetes 02/11/2018   Environmental allergies 06/12/2017   Ventricular premature complex 12/08/2016   Dyslipidemia 02/25/2016   Secondary amenorrhea 02/06/2014    Past Surgical  History:  Procedure Laterality Date   IR RADIOLOGIST EVAL & MGMT  01/28/2024   WISDOM TOOTH EXTRACTION      OB History     Gravida  4   Para  4   Term  4   Preterm      AB      Living  4      SAB      IAB      Ectopic      Multiple      Live Births  4            Home Medications    Prior to Admission medications   Medication Sig Start Date End Date Taking? Authorizing Provider  meclizine (ANTIVERT) 25 MG tablet Take 1 tablet (25 mg total) by mouth 3 (three) times daily as needed for dizziness. 08/04/24  Yes Johnie, Marquisa Salih A, NP  acetaminophen  (TYLENOL ) 500 MG tablet Take 500 mg by mouth every 6 (six) hours as needed for moderate pain.    [provider]  Cholecalciferol (VITAMIN D ) 50 MCG (2000 UT) tablet Take 4,000 Units by mouth every other day.    [provider]  fluticasone  (FLONASE ) 50 MCG/ACT nasal spray Place 1 spray into both nostrils daily. 03/03/24   Merlynn Niki FALCON, FNP  levocetirizine (XYZAL ) 5 MG tablet Take 1 tablet (5 mg total) by mouth every evening. 03/03/24   Joyce, Britney F, FNP  Multiple Vitamin (MULTIVITAMIN WITH MINERALS) TABS tablet Take 1 tablet by mouth daily.  [provider]  omeprazole  (PRILOSEC) 20 MG capsule Take 1 capsule (20 mg total) by mouth daily. 03/03/24   Merlynn Niki FALCON, FNP    Family History Family History  Problem Relation Age of Onset   Diabetes Mother    Hypertension Mother    Hypertension Sister    Diabetes Sister    Diabetes Maternal Grandmother    Anesthesia problems Neg Hx    Hypotension Neg Hx    Malignant hyperthermia Neg Hx    Pseudochol deficiency Neg Hx     Social History Social History   Tobacco Use   Smoking status: Never    Passive exposure: Never   Smokeless tobacco: Never  Vaping Use   Vaping status: Never Used  Substance Use Topics   Alcohol use: No   Drug use: No     Allergies   Patient has no known allergies.   Review of Systems Review of  Systems  Per HPI  Physical Exam Triage Vital Signs ED Triage Vitals  Encounter Vitals Group     BP 08/04/24 1351 121/86     Girls Systolic BP Percentile --      Girls Diastolic BP Percentile --      Boys Systolic BP Percentile --      Boys Diastolic BP Percentile --      Pulse Rate 08/04/24 1351 88     Resp 08/04/24 1351 17     Temp 08/04/24 1351 98.8 F (37.1 C)     Temp Source 08/04/24 1351 Oral     SpO2 08/04/24 1351 99 %     Weight --      Height --      Head Circumference --      Peak Flow --      Pain Score 08/04/24 1350 7     Pain Loc --      Pain Education --      Exclude from Growth Chart --    No data found.  Updated Vital Signs BP 121/86 (BP Location: Right Arm)   Pulse 88   Temp 98.8 F (37.1 C) (Oral)   Resp 17   LMP 11/25/2012 Comment: stopped breast feeding Nov2014  SpO2 99%   Visual Acuity Right Eye Distance:   Left Eye Distance:   Bilateral Distance:    Right Eye Near:   Left Eye Near:    Bilateral Near:     Physical Exam Vitals and nursing note reviewed.  Constitutional:      General: She is awake. She is not in acute distress.    Appearance: Normal appearance. She is well-developed and well-groomed. She is not ill-appearing.  HENT:     Right Ear: Tympanic membrane, ear canal and external ear normal.     Left Ear: Tympanic membrane, ear canal and external ear normal.     Nose: Congestion and rhinorrhea present.     Mouth/Throat:     Mouth: Mucous membranes are moist.     Pharynx: Posterior oropharyngeal erythema present. No oropharyngeal exudate.  Cardiovascular:     Rate and Rhythm: Normal rate and regular rhythm.  Pulmonary:     Effort: Pulmonary effort is normal.     Breath sounds: Normal breath sounds.  Abdominal:     General: Abdomen is flat. Bowel sounds are normal. There is no distension.     Palpations: Abdomen is soft. There is no mass.     Tenderness: There is no abdominal tenderness. There is no right CVA tenderness,  left CVA tenderness, guarding or rebound.     Hernia: No hernia is present.  Skin:    General: Skin is warm and dry.  Neurological:     General: No focal deficit present.     Mental Status: She is alert and oriented to person, place, and time. Mental status is at baseline.     GCS: GCS eye subscore is 4. GCS verbal subscore is 5. GCS motor subscore is 6.     Cranial Nerves: Cranial nerves 2-12 are intact.     Sensory: Sensation is intact.     Motor: Motor function is intact.     Coordination: Coordination is intact.     Gait: Gait is intact.  Psychiatric:        Behavior: Behavior is cooperative.      UC Treatments / Results  Labs (all labs ordered are listed, but only abnormal results are displayed) Labs Reviewed  POCT RAPID STREP A (OFFICE)  POC COVID19/FLU A&B COMBO    EKG   Radiology No results found.  Procedures Procedures (including critical care time)  Medications Ordered in UC Medications - No data to display  Initial Impression / Assessment and Plan / UC Course  I have reviewed the triage vital signs and the nursing notes.  Pertinent labs & imaging results that were available during my care of the patient were reviewed by me and considered in my medical decision making (see chart for details).     Patient is overall well-appearing.  Vitals are stable.  Congestion and rhinorrhea are present, mild erythema noted to posterior oropharynx.  No other significant findings on exam.  No neurodeficits noted.  GCS 15.    Rapid strep, COVID, and flu testing all negative.  Symptoms likely viral in nature.  Prescribed meclizine as needed for dizziness and nausea.  Recommend ibuprofen  and Tylenol  as needed for sore throat, headache, and any fever.  Discussed importance of hydration.  Discussed follow-up and return precautions. Final Clinical Impressions(s) / UC Diagnoses   Final diagnoses:  Sore throat  Viral syndrome  Dizziness     Discharge Instructions       Your strep, COVID, and flu testing were all negative.  I believe your symptoms are likely related to a viral illness. You can take meclizine every 8 hours as needed for dizziness and this will also help with nausea. Alternate between ibuprofen  and Tylenol  every 6-8 hours as needed for sore throat, headache, and any fever. Make sure you are staying hydrated and getting plenty of rest. Follow-up with your primary care provider or return here as needed.   ED Prescriptions     Medication Sig Dispense Auth. Provider   meclizine (ANTIVERT) 25 MG tablet Take 1 tablet (25 mg total) by mouth 3 (three) times daily as needed for dizziness. 30 tablet Johnie Flaming A, NP      PDMP not reviewed this encounter.   Johnie Flaming A, NP 08/04/24 1553

## 2024-08-04 NOTE — Discharge Instructions (Signed)
 Your strep, COVID, and flu testing were all negative.  I believe your symptoms are likely related to a viral illness. You can take meclizine every 8 hours as needed for dizziness and this will also help with nausea. Alternate between ibuprofen  and Tylenol  every 6-8 hours as needed for sore throat, headache, and any fever. Make sure you are staying hydrated and getting plenty of rest. Follow-up with your primary care provider or return here as needed.

## 2024-08-04 NOTE — ED Triage Notes (Signed)
 Pt having sore throat, fatigue, dizziness and body aches for 3 days. Took ibuprofen  last night, had nothing today.

## 2024-11-05 ENCOUNTER — Ambulatory Visit: Payer: Self-pay | Admitting: Internal Medicine
